# Patient Record
Sex: Female | Born: 1940 | Race: Black or African American | Hispanic: No | Smoking: Never smoker
Health system: Southern US, Community
[De-identification: ages and names within clinical notes are randomized; demographics above are authoritative.]

## PROBLEM LIST (undated history)

## (undated) DIAGNOSIS — M8580 Other specified disorders of bone density and structure, unspecified site: Secondary | ICD-10-CM

## (undated) DIAGNOSIS — E781 Pure hyperglyceridemia: Secondary | ICD-10-CM

## (undated) DIAGNOSIS — G4700 Insomnia, unspecified: Secondary | ICD-10-CM

## (undated) DIAGNOSIS — F32A Depression, unspecified: Secondary | ICD-10-CM

## (undated) DIAGNOSIS — E785 Hyperlipidemia, unspecified: Secondary | ICD-10-CM

## (undated) DIAGNOSIS — F329 Major depressive disorder, single episode, unspecified: Secondary | ICD-10-CM

## (undated) HISTORY — DX: Depression, unspecified: F32.A

## (undated) HISTORY — DX: Major depressive disorder, single episode, unspecified: F32.9

## (undated) HISTORY — DX: Hyperlipidemia, unspecified: E78.5

## (undated) HISTORY — DX: Other specified disorders of bone density and structure, unspecified site: M85.80

## (undated) HISTORY — DX: Pure hyperglyceridemia: E78.1

## (undated) HISTORY — DX: Insomnia, unspecified: G47.00

---

## 1989-02-15 HISTORY — PX: ABDOMINAL HYSTERECTOMY: SHX81

## 1995-02-16 HISTORY — PX: COSMETIC SURGERY: SHX468

## 1999-03-11 ENCOUNTER — Other Ambulatory Visit: Admission: RE | Admit: 1999-03-11 | Discharge: 1999-03-11 | Payer: Self-pay | Admitting: *Deleted

## 1999-05-05 ENCOUNTER — Ambulatory Visit (HOSPITAL_COMMUNITY): Admission: RE | Admit: 1999-05-05 | Discharge: 1999-05-05 | Payer: Self-pay | Admitting: Gastroenterology

## 2000-05-02 ENCOUNTER — Other Ambulatory Visit: Admission: RE | Admit: 2000-05-02 | Discharge: 2000-05-02 | Payer: Self-pay | Admitting: *Deleted

## 2004-10-16 LAB — HM COLONOSCOPY

## 2004-11-09 ENCOUNTER — Ambulatory Visit (HOSPITAL_COMMUNITY): Admission: RE | Admit: 2004-11-09 | Discharge: 2004-11-09 | Payer: Self-pay | Admitting: Gastroenterology

## 2004-12-03 ENCOUNTER — Ambulatory Visit: Payer: Self-pay | Admitting: Family Medicine

## 2005-04-21 ENCOUNTER — Ambulatory Visit: Payer: Self-pay | Admitting: Family Medicine

## 2005-11-23 DIAGNOSIS — M810 Age-related osteoporosis without current pathological fracture: Secondary | ICD-10-CM

## 2005-11-23 DIAGNOSIS — G4700 Insomnia, unspecified: Secondary | ICD-10-CM

## 2006-01-04 ENCOUNTER — Ambulatory Visit: Payer: Self-pay | Admitting: Family Medicine

## 2006-01-04 DIAGNOSIS — F339 Major depressive disorder, recurrent, unspecified: Secondary | ICD-10-CM | POA: Insufficient documentation

## 2006-01-05 DIAGNOSIS — E785 Hyperlipidemia, unspecified: Secondary | ICD-10-CM

## 2006-01-05 LAB — CONVERTED CEMR LAB
AST: 18 units/L (ref 0–37)
Alkaline Phosphatase: 85 units/L (ref 39–117)
BUN: 11 mg/dL (ref 6–23)
CO2: 30 meq/L (ref 19–32)
Cholesterol: 226 mg/dL — ABNORMAL HIGH (ref 0–200)
Sodium: 142 meq/L (ref 135–145)
Total CHOL/HDL Ratio: 4.3
Total Protein: 7.6 g/dL (ref 6.0–8.3)

## 2006-01-20 ENCOUNTER — Telehealth: Payer: Self-pay | Admitting: Family Medicine

## 2006-01-27 ENCOUNTER — Encounter: Payer: Self-pay | Admitting: Family Medicine

## 2007-05-11 ENCOUNTER — Ambulatory Visit: Payer: Self-pay | Admitting: Family Medicine

## 2007-05-11 ENCOUNTER — Encounter: Admission: RE | Admit: 2007-05-11 | Discharge: 2007-05-11 | Payer: Self-pay | Admitting: Family Medicine

## 2007-05-12 ENCOUNTER — Encounter: Payer: Self-pay | Admitting: Family Medicine

## 2007-05-12 LAB — CONVERTED CEMR LAB
ALT: 10 units/L (ref 0–35)
AST: 16 units/L (ref 0–37)
Albumin: 5.1 g/dL (ref 3.5–5.2)
CO2: 24 meq/L (ref 19–32)
Calcium: 10.4 mg/dL (ref 8.4–10.5)
Chloride: 103 meq/L (ref 96–112)
Cholesterol: 246 mg/dL — ABNORMAL HIGH (ref 0–200)
Creatinine, Ser: 0.65 mg/dL (ref 0.40–1.20)
Glucose, Bld: 84 mg/dL (ref 70–99)
HDL goal, serum: 40 mg/dL
HDL: 53 mg/dL (ref >39)
LDL Cholesterol: 170 mg/dL — ABNORMAL HIGH (ref 0–99)
MCHC: 32.3 g/dL (ref 30.0–36.0)
Platelets: 423 10*3/uL — ABNORMAL HIGH (ref 150–400)
Sodium: 141 meq/L (ref 135–145)
TSH: 3.86 microintl units/mL (ref 0.350–5.50)
Total Bilirubin: 0.7 mg/dL (ref 0.3–1.2)
Triglycerides: 116 mg/dL (ref <150)

## 2007-05-16 ENCOUNTER — Telehealth: Payer: Self-pay | Admitting: Family Medicine

## 2007-05-24 ENCOUNTER — Telehealth: Payer: Self-pay | Admitting: Family Medicine

## 2008-04-08 ENCOUNTER — Ambulatory Visit: Payer: Self-pay | Admitting: Family Medicine

## 2008-04-08 DIAGNOSIS — M79609 Pain in unspecified limb: Secondary | ICD-10-CM

## 2008-04-08 DIAGNOSIS — R519 Headache, unspecified: Secondary | ICD-10-CM | POA: Insufficient documentation

## 2008-04-08 DIAGNOSIS — R209 Unspecified disturbances of skin sensation: Secondary | ICD-10-CM | POA: Insufficient documentation

## 2008-04-08 DIAGNOSIS — R51 Headache: Secondary | ICD-10-CM | POA: Insufficient documentation

## 2008-04-09 ENCOUNTER — Encounter: Payer: Self-pay | Admitting: Family Medicine

## 2008-04-09 LAB — CONVERTED CEMR LAB
BUN: 11 mg/dL (ref 6–23)
Calcium: 9.9 mg/dL (ref 8.4–10.5)
Chloride: 103 meq/L (ref 96–112)
Folate: 10.4 ng/mL
Glucose, Bld: 103 mg/dL — ABNORMAL HIGH (ref 70–99)
HCT: 38.4 % (ref 36.0–46.0)
MCV: 91 fL (ref 78.0–100.0)
RBC: 4.22 M/uL (ref 3.87–5.11)
Vitamin B-12: 570 pg/mL (ref 211–911)
WBC: 7.4 10*3/uL (ref 4.0–10.5)

## 2008-05-15 ENCOUNTER — Telehealth (INDEPENDENT_AMBULATORY_CARE_PROVIDER_SITE_OTHER): Payer: Self-pay | Admitting: *Deleted

## 2008-10-29 ENCOUNTER — Ambulatory Visit: Payer: Self-pay | Admitting: Family Medicine

## 2008-11-06 ENCOUNTER — Telehealth: Payer: Self-pay | Admitting: Family Medicine

## 2008-11-19 ENCOUNTER — Encounter: Admission: RE | Admit: 2008-11-19 | Discharge: 2008-11-19 | Payer: Self-pay | Admitting: Family Medicine

## 2008-11-19 ENCOUNTER — Ambulatory Visit: Payer: Self-pay | Admitting: Family Medicine

## 2008-11-19 DIAGNOSIS — M25559 Pain in unspecified hip: Secondary | ICD-10-CM | POA: Insufficient documentation

## 2008-11-20 ENCOUNTER — Encounter: Payer: Self-pay | Admitting: Family Medicine

## 2008-12-02 ENCOUNTER — Telehealth: Payer: Self-pay | Admitting: Family Medicine

## 2008-12-11 ENCOUNTER — Encounter: Payer: Self-pay | Admitting: Family Medicine

## 2008-12-21 ENCOUNTER — Encounter: Admission: RE | Admit: 2008-12-21 | Discharge: 2008-12-21 | Payer: Self-pay | Admitting: Sports Medicine

## 2009-05-29 ENCOUNTER — Ambulatory Visit: Payer: Self-pay | Admitting: Family Medicine

## 2009-09-25 ENCOUNTER — Ambulatory Visit: Payer: Self-pay | Admitting: Family Medicine

## 2009-09-25 DIAGNOSIS — L299 Pruritus, unspecified: Secondary | ICD-10-CM | POA: Insufficient documentation

## 2009-09-26 LAB — CONVERTED CEMR LAB
ALT: 9 units/L (ref 0–35)
Albumin: 4.6 g/dL (ref 3.5–5.2)
BUN: 11 mg/dL (ref 6–23)
Chloride: 104 meq/L (ref 96–112)
Glucose, Bld: 80 mg/dL (ref 70–99)
Sodium: 140 meq/L (ref 135–145)
Total Bilirubin: 0.8 mg/dL (ref 0.3–1.2)
Total Protein: 6.9 g/dL (ref 6.0–8.3)
VLDL: 14 mg/dL (ref 0–40)

## 2009-10-09 ENCOUNTER — Ambulatory Visit: Payer: Self-pay | Admitting: Family Medicine

## 2009-10-09 DIAGNOSIS — R6882 Decreased libido: Secondary | ICD-10-CM

## 2009-10-09 DIAGNOSIS — R634 Abnormal weight loss: Secondary | ICD-10-CM

## 2010-03-19 NOTE — Assessment & Plan Note (Signed)
Summary: depression   Vital Signs:  Patient profile:   70 year old female Height:      59 inches Weight:      111 pounds BMI:     22.50 Pulse rate:   55 / minute BP sitting:   123 / 63  (left arm) Cuff size:   regular  Vitals Entered By: Avon Gully CMA, Duncan Dull) (September 25, 2009 8:14 AM) CC: f/u depression/anxiety pt is stlil lnot sleeping,fatigue,cant eat, stopped taking citalopram because it made her head  hurt   Primary Care Provider:  Linford Arnold, C  CC:  f/u depression/anxiety pt is stlil lnot sleeping, fatigue, cant eat, and stopped taking citalopram because it made her head  hurt.  History of Present Illness: f/u depression/anxiety pt is stlil lnot sleeping, fatigue, cant eat, stopped taking citalopram because it made her head  hurt.  Tried the citalopram for 30 days and it was giving her HA. Still using her AMbien for sleep.  Feeling more anxious overall .  Feels a little more irritable.  Her mom passed away since the last time I saw her from CHF.   Her feet have been itching all over. No rash. She will scratch until it breaks the skin. No rash.    Current Medications (verified): 1)  Ambien 10 Mg Tabs (Zolpidem Tartrate) .... Take 1 Tablet By Mouth Once A Day  Allergies (verified): No Known Drug Allergies  Comments:  Nurse/Medical Assistant: The patient's medications and allergies were reviewed with the patient and were updated in the Medication and Allergy Lists. Avon Gully CMA, Duncan Dull) (September 25, 2009 8:16 AM)  Family History: Father-Lung Ca-smoker Mother -abd aneurysm, CHF.   Physical Exam  General:  Well-developed,well-nourished,in no acute distress; alert,appropriate and cooperative throughout examination Neck:  No deformities, masses, or tenderness noted. NO TM.  Lungs:  Normal respiratory effort, chest expands symmetrically. Lungs are clear to auscultation, no crackles or wheezes. Heart:  Normal rate and regular rhythm. S1 and S2 normal without  gallop, murmur, click, rub or other extra sounds. Skin:  Skin on feet look normal. a few excoriation.  No rash.    Impression & Recommendations:  Problem # 1:  DISORDER, DEPRESSIVE NEC (ICD-311) PHQ-9 score is 18 (mod severe).  Discussed changing to sertraline since the citalopram gave her HA.  F/U in one month. Call if any questions.  The following medications were removed from the medication list:    Citalopram Hydrobromide 20 Mg Tabs (Citalopram hydrobromide) .Marland Kitchen... 1/2 tab by mouth for one week, then increase to one a day. Her updated medication list for this problem includes:    Sertraline Hcl 50 Mg Tabs (Sertraline hcl) .Marland Kitchen... 1/2 tab by mouth daily then incrase to whole tab.  Problem # 2:  PRURITUS (ICD-698.9) Will check liver and thyrod levels.  Though no rash to cause his sxs.  May be related to her anxiety.  Orders: T-Comprehensive Metabolic Panel ([id number]) T-TSH ([id number])  Complete Medication List: 1)  Ambien 10 Mg Tabs (Zolpidem tartrate) .... Take 1 tablet by mouth once a day 2)  Sertraline Hcl 50 Mg Tabs (Sertraline hcl) .... 1/2 tab by mouth daily then incrase to whole tab.  Other Orders: T-Lipid Profile ([id number])  Patient Instructions: 1)  Please schedule a follow-up appointment in 1 month for mood. 2)  Will start sertraline.   Prescriptions: AMBIEN 10 MG TABS (ZOLPIDEM TARTRATE) Take 1 tablet by mouth once a day  #30 x 2   Entered and Authorized by:  Nani Gasser MD   Signed by:   Nani Gasser MD on 09/25/2009   Method used:   Printed then faxed to ...       [facility] [phone]* (retail)       [address] [place], [place]  [address]       Ph: [phone]       Fax: [phone]   RxID:   [id number] SERTRALINE HCL 50 MG TABS (SERTRALINE HCL) 1/2 tab by mouth daily then incrase to whole tab.  #30 x 1   Entered and Authorized by:   Nani Gasser MD   Signed by:   Nani Gasser MD on 09/25/2009   Method used:    Printed then faxed to ...       [facility] [phone]* (retail)       [address] [place], [place]  [address]       Ph: [phone]       Fax: [phone]   RxID:   [id number]

## 2010-03-19 NOTE — Assessment & Plan Note (Signed)
Summary: Discuss wt loss   Vital Signs:  Patient profile:   70 year old female Height:      59 inches Weight:      111 pounds Pulse rate:   58 / minute BP sitting:   123 / 66  (left arm) Cuff size:   small  Vitals Entered By: Avon Gully CMA, Duncan Dull) (October 09, 2009 3:43 PM) CC: wants periactin to stop wt loss   Primary Care Jaleeyah Munce:  Linford Arnold, C  CC:  wants periactin to stop wt loss.  History of Present Illness: Has lost of appetite and has been on periactin in the past.  Feels nauseated if she tries to eat. Has been downto 89 lbs in the past. Off it for years.  Her sister has had to use it as well.  She tolerates it well and was only using the once a day dose.   did start the sertaline and is not having HA with it. Says has already notice some iprovement.    She would also like to restart estratest to improve her sex drive. Has been off it for years but would like to restart it for a short period of time.   Current Medications (verified): 1)  Ambien 10 Mg Tabs (Zolpidem Tartrate) .... Take 1 Tablet By Mouth Once A Day 2)  Sertraline Hcl 50 Mg Tabs (Sertraline Hcl) .... 1/2 Tab By Mouth Daily Then Incrase To Whole Tab.  Allergies (verified): No Known Drug Allergies  Comments:  Nurse/Medical Assistant: The patient's medications and allergies were reviewed with the patient and were updated in the Medication and Allergy Lists. Avon Gully CMA, Duncan Dull) (October 09, 2009 3:45 PM)  Physical Exam  General:  Well-developed,well-nourished,in no acute distress; alert,appropriate and cooperative throughout examination Psych:  Cognition and judgment appear intact. Alert and cooperative with normal attention span and concentration. No apparent delusions, illusions, hallucinations   Impression & Recommendations:  Problem # 1:  DISORDER, DEPRESSIVE NEC (ICD-311) F/ in one month. Repeat PHQ-9 and adjust dose if needed. I am happy she isnot having SE with this one.  Her  updated medication list for this problem includes:    Sertraline Hcl 50 Mg Tabs (Sertraline hcl) .Marland Kitchen... 1/2 tab by mouth daily then incrase to whole tab.  Problem # 2:  LIBIDO, DECREASED (ICD-799.81) Will restart HRT for a short period of time.   Problem # 3:  WEIGHT LOSS, ABNORMAL (ICD-783.21) Assessment: New Will start periactin. F/U if not improving her weight.  Call if continues to lose weight.  Dsicussed potential SE. She has done well on it in the past. I was also hopin inmproving her mood will help her appetite as well.   Complete Medication List: 1)  Ambien 10 Mg Tabs (Zolpidem tartrate) .... Take 1 tablet by mouth once a day 2)  Sertraline Hcl 50 Mg Tabs (Sertraline hcl) .... 1/2 tab by mouth daily then incrase to whole tab. 3)  Cyproheptadine Hcl 4 Mg Tabs (Cyproheptadine hcl) .... Take 1 tablet by mouth once a day 4)  Estratest  .... Take 1 tablet by mouth once a day Prescriptions: ESTRATEST Take 1 tablet by mouth once a day  #30 x 3   Entered and Authorized by:   Nani Gasser MD   Signed by:   Nani Gasser MD on 10/09/2009   Method used:   Printed then faxed to ...       [facility] [phone]* (retail)  [address], [place]  [address]       Ph: [phone]       Fax: [phone]   RxID:   [id number] CYPROHEPTADINE HCL 4 MG TABS (CYPROHEPTADINE HCL) Take 1 tablet by mouth once a day  #30 x 4   Entered and Authorized by:   Nani Gasser MD   Signed by:   Nani Gasser MD on 10/09/2009   Method used:   Electronically to        [facility] [phone]* (retail)       [address] [place], [place]  [address]       Ph: [phone]       Fax: [phone]   RxID:   [id number]

## 2010-03-19 NOTE — Letter (Signed)
Summary: Depression Questionnaire  Depression Questionnaire   Imported By: Lanelle Bal 10/06/2009 12:37:02  _____________________________________________________________________  External Attachment:    Type:   Image     Comment:   External Document

## 2010-03-19 NOTE — Assessment & Plan Note (Signed)
Summary: Depression   Vital Signs:  Patient profile:   70 year old female Height:      59 inches Weight:      122 pounds Pulse rate:   65 / minute BP sitting:   148 / 76  (left arm) Cuff size:   regular  Vitals Entered By: Kathlene November (May 29, 2009 3:58 PM) CC: not sleeping, having anxiety. Not on any meds at all stopped them all   Primary Care Provider:  Linford Arnold, C  CC:  not sleeping and having anxiety. Not on any meds at all stopped them all.  History of Present Illness: not sleeping, having anxiety. Not on any meds at all stopped them all. Very anxious.  Gets upset very easily.  Not sleeping well at all.  Helping take care of her mom on the weekend and still working full time. Sisters have been helping as well. Has a vacation coming up. Hx of chronic insomnia. Does OK on ambien and wouldl ike  a refill for as needed use. No suicidal thoughts.   Current Medications (verified): 1)  None  Allergies (verified): No Known Drug Allergies  Comments:  Nurse/Medical Assistant: The patient's medications and allergies were reviewed with the patient and were updated in the Medication and Allergy Lists. Kathlene November (May 29, 2009 3:59 PM)  Physical Exam  General:  Well-developed,well-nourished,in no acute distress; alert,appropriate and cooperative throughout examination Psych:  Cognition and judgment appear intact. Alert and cooperative with normal attention span and concentration. No apparent delusions, illusions, hallucinations   Impression & Recommendations:  Problem # 1:  DISORDER, DEPRESSIVE NEC (ICD-311) Assessment Deteriorated  Discusssed dx.   Her PHQ-9 score is 20 (severe) depression.  Dsicussed tx options with counseling, medication, exercise.  Pt prefers medication. Wills tart with citalopram. REviewed how the drugs works and potential SE. F//U in 3 weeks to make sure doing well and for dose titratin if needed. Will repeat PHQ-9 at that time.   Her updated  medication list for this problem includes:    Citalopram Hydrobromide 20 Mg Tabs (Citalopram hydrobromide) .Marland Kitchen... 1/2 tab by mouth for one week, then increase to one a day.  Complete Medication List: 1)  Citalopram Hydrobromide 20 Mg Tabs (Citalopram hydrobromide) .... 1/2 tab by mouth for one week, then increase to one a day. 2)  Ambien 10 Mg Tabs (Zolpidem tartrate) .... Take 1 tablet by mouth once a day  Patient Instructions: 1)  Please schedule a follow-up appointment in 3-4 weeks to check on your mood. .  Prescriptions: AMBIEN 10 MG TABS (ZOLPIDEM TARTRATE) Take 1 tablet by mouth once a day  #30 x 0   Entered and Authorized by:   Nani Gasser MD   Signed by:   Nani Gasser MD on 05/29/2009   Method used:   Printed then faxed to ...       [facility] [phone]* (retail)       [address] [place], [place]  [address]       Ph: [phone]       Fax: [phone]   RxID:   [id number] CITALOPRAM HYDROBROMIDE 20 MG TABS (CITALOPRAM HYDROBROMIDE) 1/2 tab by mouth for one week, then increase to one a day.  #30 x 0   Entered and Authorized by:   Nani Gasser MD   Signed by:   Nani Gasser MD on 05/29/2009   Method used:   Electronically to  [facility] [phone]* (retail)       [address] [place], [place]  [address]       Ph: [phone]       Fax: [phone]   RxID:   [id number]

## 2010-06-16 ENCOUNTER — Encounter: Payer: Self-pay | Admitting: Family Medicine

## 2010-06-16 ENCOUNTER — Ambulatory Visit: Payer: Self-pay | Admitting: Family Medicine

## 2010-06-17 ENCOUNTER — Encounter: Payer: Self-pay | Admitting: Family Medicine

## 2010-06-17 ENCOUNTER — Ambulatory Visit (INDEPENDENT_AMBULATORY_CARE_PROVIDER_SITE_OTHER): Payer: 59 | Admitting: Family Medicine

## 2010-06-17 DIAGNOSIS — E785 Hyperlipidemia, unspecified: Secondary | ICD-10-CM

## 2010-06-17 DIAGNOSIS — F329 Major depressive disorder, single episode, unspecified: Secondary | ICD-10-CM

## 2010-06-17 DIAGNOSIS — Z79890 Hormone replacement therapy (postmenopausal): Secondary | ICD-10-CM | POA: Insufficient documentation

## 2010-06-17 DIAGNOSIS — G4700 Insomnia, unspecified: Secondary | ICD-10-CM

## 2010-06-17 DIAGNOSIS — Z1231 Encounter for screening mammogram for malignant neoplasm of breast: Secondary | ICD-10-CM

## 2010-06-17 DIAGNOSIS — M8580 Other specified disorders of bone density and structure, unspecified site: Secondary | ICD-10-CM

## 2010-06-17 DIAGNOSIS — R634 Abnormal weight loss: Secondary | ICD-10-CM

## 2010-06-17 DIAGNOSIS — M949 Disorder of cartilage, unspecified: Secondary | ICD-10-CM

## 2010-06-17 MED ORDER — ZOLPIDEM TARTRATE 10 MG PO TABS: 10.0000 mg | ORAL_TABLET | Freq: Every day | ORAL | Status: DC

## 2010-06-17 MED ORDER — EST ESTROGENS-METHYLTEST 0.625-1.25 MG PO TABS: 1.0000 | ORAL_TABLET | Freq: Every day | ORAL | Status: DC

## 2010-06-17 MED ORDER — SERTRALINE HCL 50 MG PO TABS: 50.0000 mg | ORAL_TABLET | Freq: Every day | ORAL | Status: DC

## 2010-06-17 NOTE — Assessment & Plan Note (Signed)
Due to recheck. She is not on any cholesterol meds.

## 2010-06-17 NOTE — Assessment & Plan Note (Signed)
Will refill her ambien.  

## 2010-06-17 NOTE — Progress Notes (Signed)
  Subjective:    Patient ID: Christine [name], female    DOB: [birth year], 70 y.o.   MRN: [id number]  HPI Her to ger her mammo and dexa order. Colonoscopy is up to date.  Tdap and pneumococcal are up to date.   She has been out of her meds and wants to restart her HRT and her sertaline as well as her [place].    Review of Systems     Objective:   Physical Exam  Constitutional: She appears well-developed and well-nourished.  HENT:  Head: Normocephalic and atraumatic.  Neck: Neck supple. No thyromegaly present.       No carotid bruits.   Cardiovascular: Normal rate, regular rhythm and normal heart sounds.   Pulmonary/Chest: Effort normal and breath sounds normal.  Musculoskeletal: She exhibits no edema.  Lymphadenopathy:    She has no cervical adenopathy.          Assessment & Plan:

## 2010-06-17 NOTE — Patient Instructions (Signed)
Follow up in one year. Call if not doing well.

## 2010-06-17 NOTE — Assessment & Plan Note (Signed)
Out of of zoloft. She would like to restart it. New rx sent.

## 2010-06-18 ENCOUNTER — Telehealth: Payer: Self-pay | Admitting: Family Medicine

## 2010-06-18 LAB — COMPLETE METABOLIC PANEL WITH GFR
Albumin: 4.7 g/dL (ref 3.5–5.2)
Alkaline Phosphatase: 73 U/L (ref 39–117)
CO2: 25 mEq/L (ref 19–32)
Calcium: 10.2 mg/dL (ref 8.4–10.5)
Chloride: 104 mEq/L (ref 96–112)
GFR, Est African American: >60 mL/min (ref >60)
GFR, Est Non African American: >60 mL/min (ref >60)
Glucose, Bld: 85 mg/dL (ref 70–99)
Potassium: 5.1 mEq/L (ref 3.5–5.3)
Total Protein: 7.6 g/dL (ref 6.0–8.3)

## 2010-06-18 LAB — LIPID PANEL
Cholesterol: 266 mg/dL — ABNORMAL HIGH (ref 0–200)
HDL: 54 mg/dL (ref >39)
Triglycerides: 75 mg/dL (ref <150)

## 2010-06-18 NOTE — Telephone Encounter (Signed)
Call pt: CMP is nl. Chol is really high.  She really needs to be on a cholesteol pill. Let me know if ok with starting one.

## 2010-06-19 ENCOUNTER — Other Ambulatory Visit: Payer: 59

## 2010-06-19 ENCOUNTER — Telehealth: Payer: Self-pay | Admitting: *Deleted

## 2010-06-19 ENCOUNTER — Ambulatory Visit: Payer: 59

## 2010-06-19 MED ORDER — ATORVASTATIN CALCIUM 40 MG PO TABS: 40.0000 mg | ORAL_TABLET | Freq: Every day | ORAL | Status: DC

## 2010-06-19 NOTE — Telephone Encounter (Signed)
Left message on pt's cell with results and dr. Vernie Ammons and to call bck if ok to start med

## 2010-06-19 NOTE — Telephone Encounter (Signed)
Pt called and states its ok to send in chol med to [facility] [facility] [place]

## 2010-06-29 ENCOUNTER — Telehealth: Payer: Self-pay | Admitting: Family Medicine

## 2010-06-29 NOTE — Telephone Encounter (Signed)
Pt was given a new chol med and the copay was too expensive $137.49 30 day supply.  Pt would like a diff script for something cheaper..instead of the Lipitor 40 mg.  Please advise.\ Plan:  Routed to Dr. Marlyne Beards, LPN Domingo Dimes

## 2010-06-29 NOTE — Telephone Encounter (Signed)
Do you know if this was for the generic? Does she have medicare?

## 2010-07-01 ENCOUNTER — Telehealth: Payer: Self-pay | Admitting: Family Medicine

## 2010-07-01 NOTE — Telephone Encounter (Signed)
This was the lipitor that was rx'ed that was too expensive

## 2010-07-01 NOTE — Telephone Encounter (Signed)
Pt called and states a medication that was rx last week was too expensive and wants to know if there is an alternative

## 2010-07-01 NOTE — Telephone Encounter (Signed)
Dr. Linford Arnold, Called the pharmacist and Lipitor just went generic(Torvastatin) and is still a high copay of $137.59 for 30 day supply.  The simvastatin, lovastatin, or pravastatin is all under $10.00 copay.  Please advise as to what to do with the Lipitor 40 mg script.  Thanks. Plan:  Routed to Dr. Marlyne Beards, LPN Domingo Dimes

## 2010-07-02 ENCOUNTER — Telehealth: Payer: Self-pay | Admitting: Family Medicine

## 2010-07-02 MED ORDER — SIMVASTATIN 40 MG PO TABS: 40.0000 mg | ORAL_TABLET | Freq: Every day | ORAL | Status: DC

## 2010-07-02 NOTE — Telephone Encounter (Signed)
Can you see if she has medicare or not?

## 2010-07-02 NOTE — Telephone Encounter (Signed)
We Can change to simvastatin.  New rx sent.

## 2010-07-02 NOTE — Telephone Encounter (Signed)
Tried to call pt but unable to get .  A new script for simvastatin 40 mg PO was sent to pt pharmacy and a message was left stating the copay should be under $10 copay since generic.  Told pt to call with any questions or concerns. Jarvis Newcomer, LPN Domingo Dimes

## 2010-07-03 NOTE — Op Note (Signed)
NAMELADYE, Christine [name]             ACCOUNT NO.:  [id number]   MEDICAL RECORD NO.:  [id number]          PATIENT TYPE:  AMB   LOCATION:  ENDO                         FACILITY:  [facility]   PHYSICIAN:  Anselmo Rod, M.D.  DATE OF BIRTH:  [birth year]   DATE OF PROCEDURE:  11/09/2004  DATE OF DISCHARGE:                                 OPERATIVE REPORT   PROCEDURE PERFORMED:  Screening colonoscopy.   ENDOSCOPIST:  Charna Elizabeth, M.D.   INSTRUMENT USED:  Olympus video colonoscope.   INDICATIONS FOR PROCEDURE:  The patient is a 70 year old African-American  female undergoing screening colonoscopy to rule out colonic polyps, masses,  etc.   PREPROCEDURE PREPARATION:  Informed consent was procured from the patient.  The patient was fasted for four hours prior to the procedure and prepped  OsmoPrep pills (20 pills) the night prior to and (12 pills) the morning of  the procedure.  The risks and benefits of the procedure including a 10% miss  rate for polyps or cancers was discussed with the patient as well.   PREPROCEDURE PHYSICAL:  The patient had stable vital signs.  Neck supple.  Chest clear to auscultation.  S1 and S2 regular.  Abdomen soft with normal  bowel sounds.   DESCRIPTION OF PROCEDURE:  The patient was placed in left lateral decubitus  position and sedated with 60 mg of Demerol and 6 mg of Versed in slow  incremental doses.  Once the patient was adequately sedated and maintained  on low flow oxygen and continuous cardiac monitoring, the Olympus video  colonoscope was advanced from the rectum to the cecum.  The appendicular  orifice and ileocecal valve were clearly visualized and photographed.  The  terminal ileum appeared normal.  A few scattered diverticula were seen  throughout the colon.  Prominent internal hemorrhoids were seen on  retroflexion.  The terminal ileum appeared normal.  No masses or polyps were  appreciated and the patient tolerated the procedure well without  complication.   IMPRESSION:  1.  Few scattered diverticula seen in the colon.  2.  Prominent internal hemorrhoids.  3.  Normal terminal ileum.  4.  No masses or polyps seen.   RECOMMENDATIONS:  1.  Repeat colonoscopy is recommended in the next 10 years unless the      patient develops any abnormal symptoms in the interim.  2.  A high fiber diet with liberal fluid intake has been advocated and      brochures on diverticulosis have been given to the patient for her      education.  3.  Outpatient followup is advised as need arises in the future.      Anselmo Rod, M.D.  Electronically Signed     JNM/MEDQ  D:  11/09/2004  T:  11/10/2004  Job:  [id number]   cc:   Andres Ege, M.D.  Fax: [phone]

## 2010-07-03 NOTE — Op Note (Signed)
[facility]  Patient:    [name], Christine                      MRN: [id number] Proc. Date: 05/05/99 Adm. Date:  [id number] Attending:  Charna Elizabeth CC:         Heather Roberts, M.D.                           Operative Report  DATE OF BIRTH:  [birth year].  REFERRING PHYSICIAN:  Heather Roberts, M.D.  PROCEDURE PERFORMED:   Flexible sigmoidoscopy.  ENDOSCOPIST:  Anselmo Rod, M.D.  INSTRUMENT USED:  Olympus video colonoscope.  INDICATION FOR PROCEDURE:  Fleeting flexible sigmoidoscopy being performed in a  70 year old black female to rule out masses, polyps.  PREPROCEDURE PREPARATION:  Informed consent was procured from the patient. Patient was fasted for eight hours prior to the procedure and prepped with two Fleets enemas the morning of the procedure.  PREPROCEDURE PHYSICAL:  VITAL SIGNS:  The patient had stable vital signs.  NECK:  Supple.  CHEST:  Clear to auscultation.  S1, S2 regular.  ABDOMEN:  Soft, with normal abdominal bowel sounds.  DESCRIPTION OF PROCEDURE:  The patient was placed in the left lateral decubitus  position and no sedation was used.  Once the patient was adequately positioned, the Olympus video colonoscope was advanced from the rectum to 70 cm without difficulty. One isolated diverticulum was seen in the mid left colon.  Small internal hemorrhoids were seen.  No other abnormalities were recognized up to 70 cm. The patient tolerated the procedure well without complication.  IMPRESSION: 1. Small internal hemorrhoid. 2. One isolated diverticulum in the mid left colon. 3. No masses or polyps seen.  RECOMMENDATIONS: 1. The patient has been advised to increase her fluid and fiber in her diet. 2. Repeat colorectal cancer screening ______ for the next five years or    earlier if she were to develop any symptoms in the interim. 3. She has been advised to follow up with Dr. Orson Aloe and to contact me with   further GI problems. DD:  05/05/99 TD:  05/05/99 Job: [id number]

## 2010-07-07 ENCOUNTER — Telehealth: Payer: Self-pay | Admitting: Family Medicine

## 2010-07-07 ENCOUNTER — Ambulatory Visit
Admission: RE | Admit: 2010-07-07 | Discharge: 2010-07-07 | Disposition: A | Discharge status: Home / Self Care | Payer: 59 | Source: Ambulatory Visit | Attending: Family Medicine | Admitting: Family Medicine

## 2010-07-07 DIAGNOSIS — Z1231 Encounter for screening mammogram for malignant neoplasm of breast: Secondary | ICD-10-CM

## 2010-07-07 DIAGNOSIS — M8580 Other specified disorders of bone density and structure, unspecified site: Secondary | ICD-10-CM

## 2010-07-07 NOTE — Telephone Encounter (Signed)
Pt notified and a copy of her recent labs were mailed to her per the pt request. Jarvis Newcomer, LPN Domingo Dimes

## 2010-07-07 NOTE — Telephone Encounter (Signed)
Pt walked in and is requesting to get cholesterol results.

## 2010-07-08 ENCOUNTER — Telehealth: Payer: Self-pay | Admitting: Family Medicine

## 2010-07-08 NOTE — Telephone Encounter (Signed)
Call pt: Done densiyt reveals osteoporosis.  I would recommend a bisphosphonate the reduce the risk of fractures.  It can be takine once a week or once a month depending on the brand.  Have to sit upright for one hour after take med. Let me know if ok with sending med. Then repeat DEXA in 2 years.

## 2010-07-09 ENCOUNTER — Telehealth: Payer: Self-pay | Admitting: Family Medicine

## 2010-07-09 NOTE — Telephone Encounter (Signed)
Call pt: Christine [name] is nl. Repeat in one year.

## 2010-07-10 MED ORDER — ALENDRONATE SODIUM 70 MG PO TABS: 70.0000 mg | ORAL_TABLET | ORAL | Status: AC

## 2010-07-10 NOTE — Telephone Encounter (Signed)
Pt notified and pt would like a gen sent to pharm. She states the one she had before was 137.00 and she cant afford that. She didn't remember the name of the medication that sent before

## 2010-07-10 NOTE — Telephone Encounter (Signed)
Pt.notified

## 2010-07-16 NOTE — Telephone Encounter (Signed)
Looks like she has [employer]. Jarvis Newcomer, LPN Domingo Dimes

## 2010-07-17 ENCOUNTER — Telehealth: Payer: Self-pay | Admitting: Family Medicine

## 2010-07-17 NOTE — Telephone Encounter (Signed)
We already sent a new rx on 5/17 for simvastatin which should only be about $10, instead of the lipitor. Has she rechecked her pharm?  Can you check and make sure we sent it to the right pharm.

## 2010-07-17 NOTE — Telephone Encounter (Signed)
Pt aware that the script was at her pharmacy for the simvastatin and that it was changed from the Lipitor. Jarvis Newcomer, LPN Domingo Dimes

## 2010-07-17 NOTE — Telephone Encounter (Signed)
Encounter closed. Akima Slaugh, LPN /Triage  

## 2011-02-12 ENCOUNTER — Other Ambulatory Visit: Payer: Self-pay | Admitting: Family Medicine

## 2011-02-12 ENCOUNTER — Other Ambulatory Visit: Payer: Self-pay | Admitting: *Deleted

## 2011-02-12 MED ORDER — EST ESTROGENS-METHYLTEST 0.625-1.25 MG PO TABS: 1.0000 | ORAL_TABLET | Freq: Every day | ORAL | Status: DC

## 2011-07-22 IMAGING — CR DG LUMBAR SPINE COMPLETE 4+V
5 series · 5 of 5 positions shown · non-contrast
Comparison: None

CLINICAL DATA: Low back and right hip pain.  No history of trauma.

LUMBAR SPINE - COMPLETE 4+ VIEW

[view not recorded (1 of 5)]
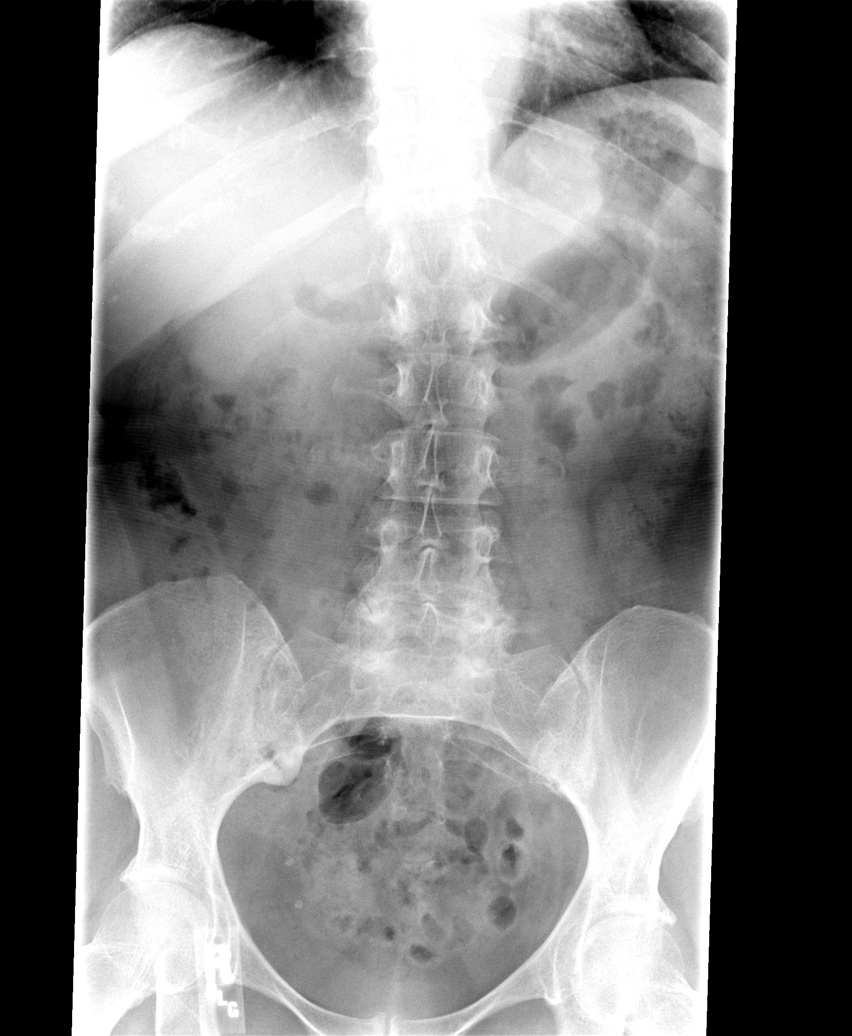

[view not recorded (2 of 5)]
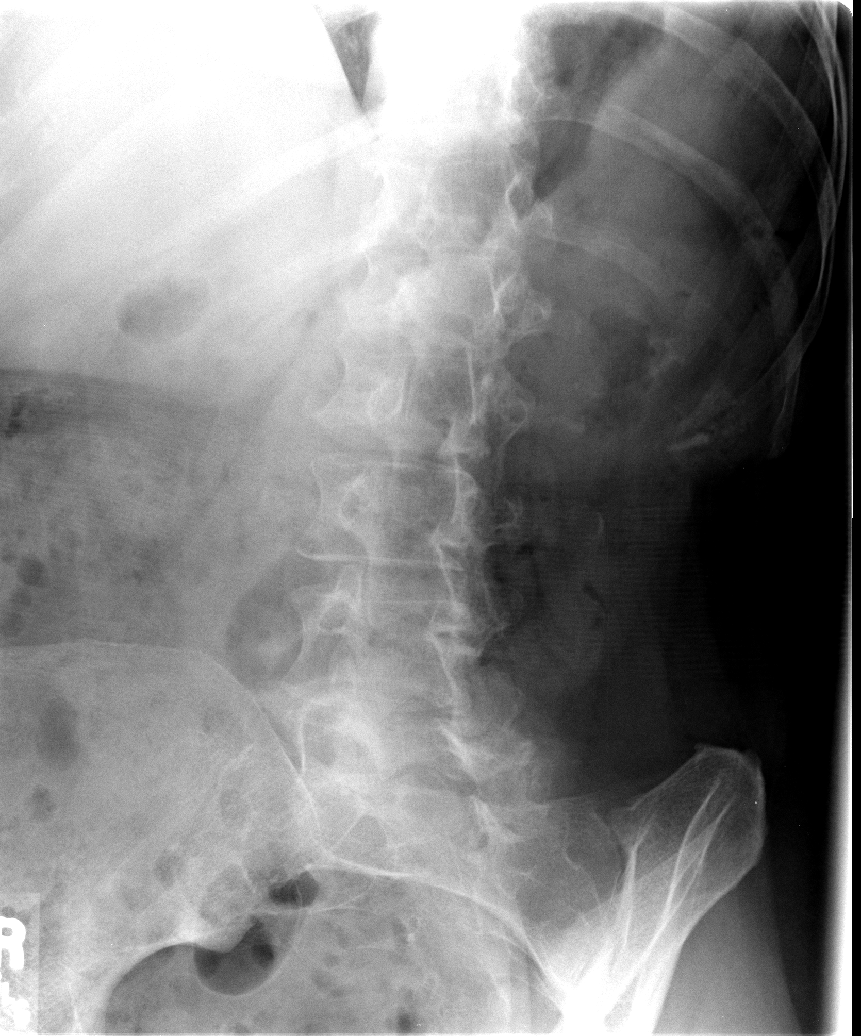

[view not recorded (3 of 5)]
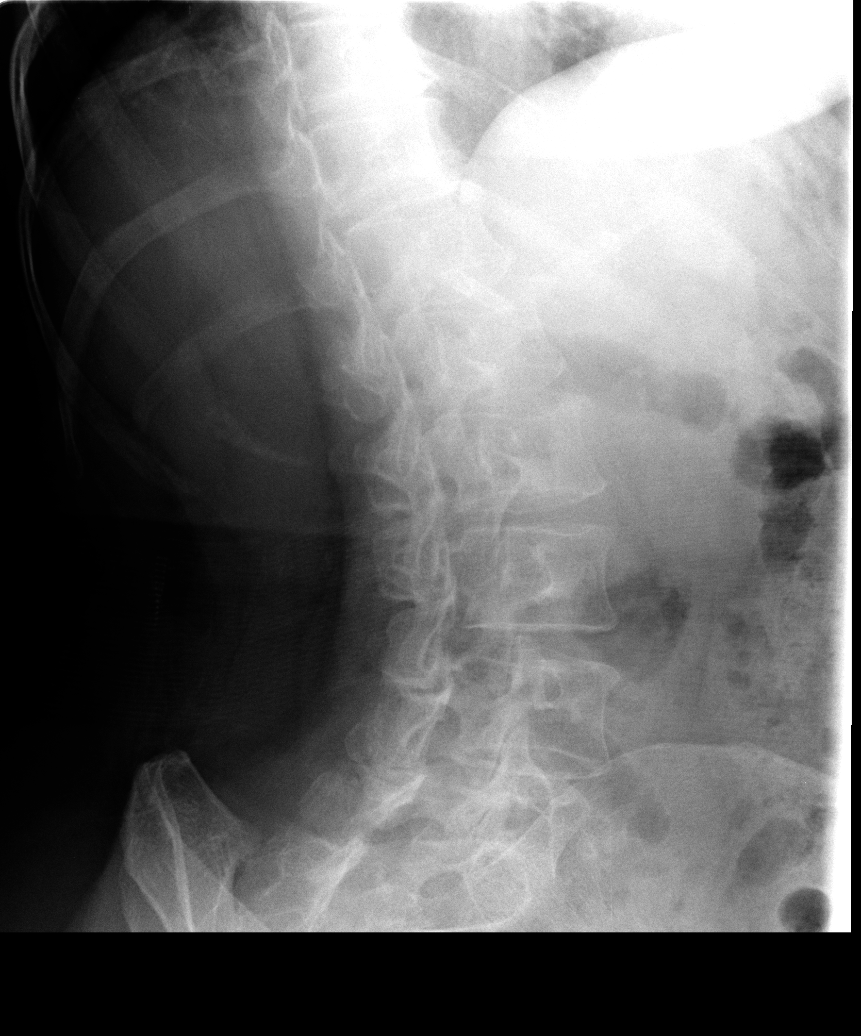

[view not recorded (4 of 5)]
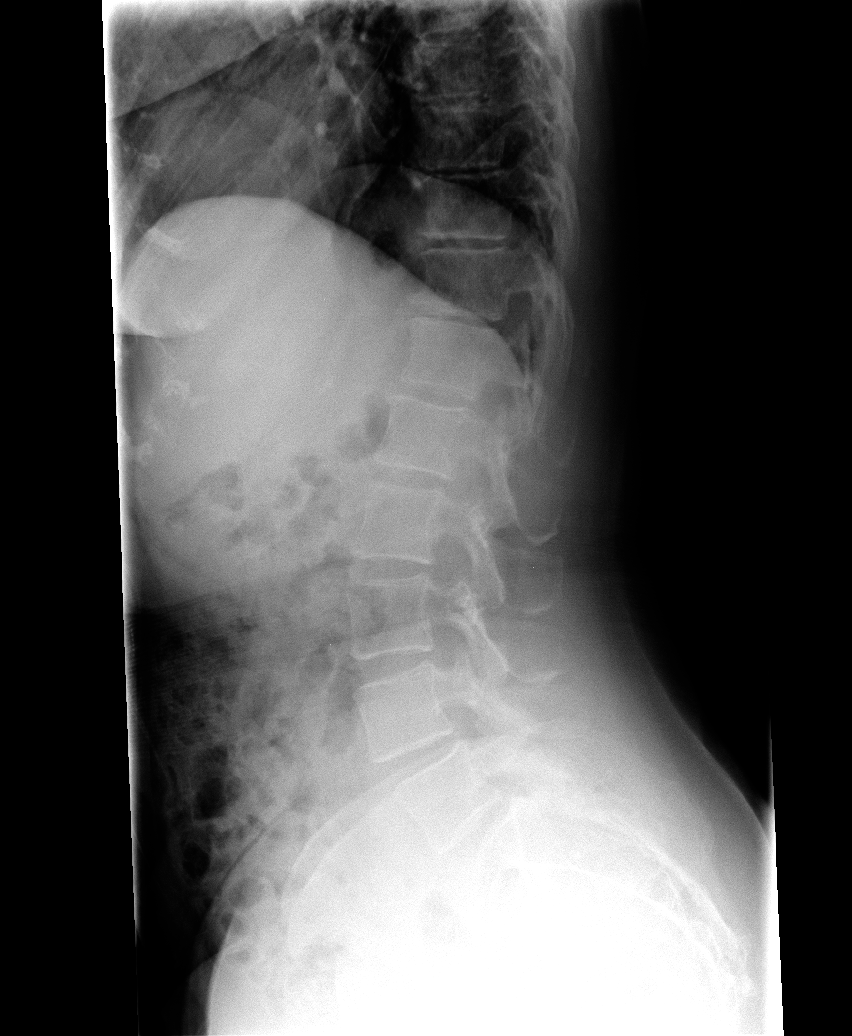

[view not recorded (5 of 5)]
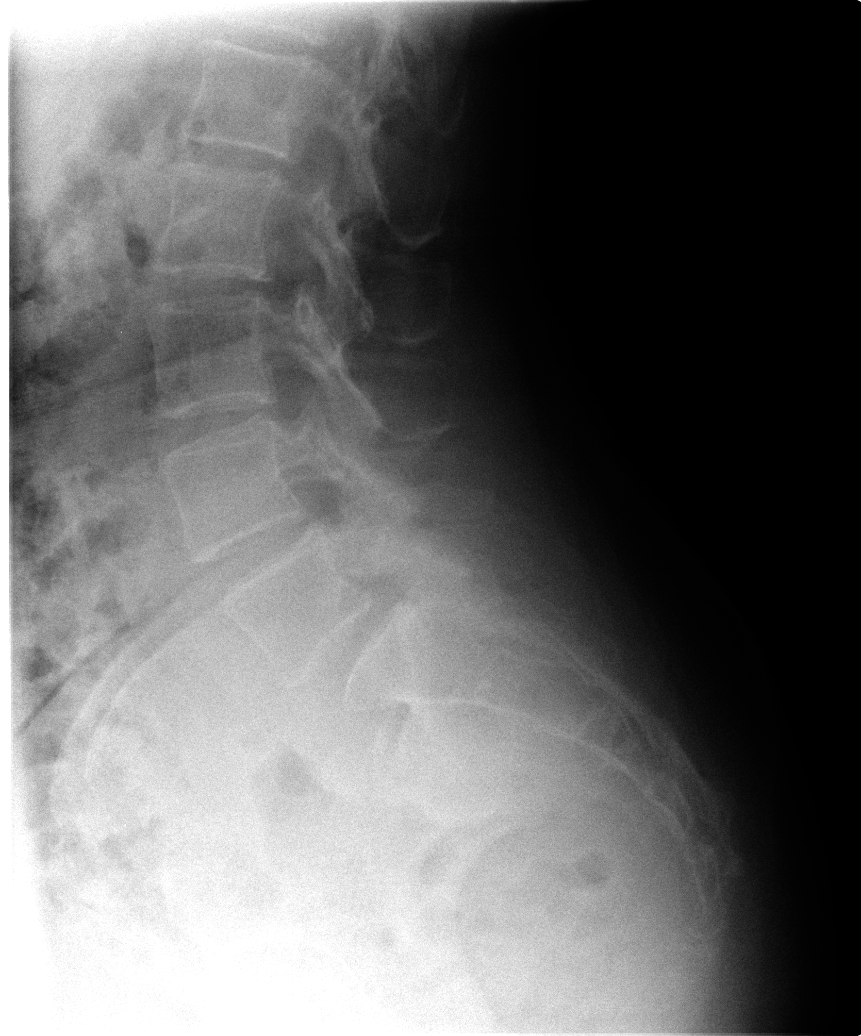

[5 of 5 positions shown; findings below may reference images not displayed]

FINDINGS: Five lumbar-type vertebral bodies.  Minimal convex left
lumbar spinal curvature centered about L3.  Moderate degenerative
changes involving the sacroiliac joints bilaterally.  Sclerosis and
osteophyte formation.

Maintenance of vertebral body height, alignment.  Intervertebral
disc heights are maintained.  Trace L5-S1 anterolisthesis.  Facet
arthropathy at L5-S1 is mild.
IMPRESSION: 1.  Mild lower lumbar spondylosis with trace L5-S1 anterolisthesis,
likely degenerative.
2.  Degenerative changes of the sacroiliac joints bilaterally.
Moderate.

## 2011-11-18 ENCOUNTER — Ambulatory Visit (INDEPENDENT_AMBULATORY_CARE_PROVIDER_SITE_OTHER): Payer: 59 | Admitting: Family Medicine

## 2011-11-18 ENCOUNTER — Encounter: Payer: Self-pay | Admitting: Family Medicine

## 2011-11-18 VITALS — BP 140/74 | HR 72 | Ht 62.0 in | Wt 125.0 lb

## 2011-11-18 DIAGNOSIS — Z23 Encounter for immunization: Secondary | ICD-10-CM

## 2011-11-18 DIAGNOSIS — E785 Hyperlipidemia, unspecified: Secondary | ICD-10-CM

## 2011-11-18 DIAGNOSIS — Z1231 Encounter for screening mammogram for malignant neoplasm of breast: Secondary | ICD-10-CM

## 2011-11-18 DIAGNOSIS — Z0000 Encounter for general adult medical examination without abnormal findings: Secondary | ICD-10-CM

## 2011-11-18 DIAGNOSIS — Z8249 Family history of ischemic heart disease and other diseases of the circulatory system: Secondary | ICD-10-CM

## 2011-11-18 MED ORDER — EST ESTROGENS-METHYLTEST 0.625-1.25 MG PO TABS: 1.0000 | ORAL_TABLET | Freq: Every day | ORAL | Status: DC

## 2011-11-18 MED ORDER — ZOLPIDEM TARTRATE 10 MG PO TABS: 10.0000 mg | ORAL_TABLET | Freq: Every evening | ORAL | Status: DC | PRN

## 2011-11-18 MED ORDER — AMBULATORY NON FORMULARY MEDICATION: Status: DC

## 2011-11-18 MED ORDER — SIMVASTATIN 40 MG PO TABS: 40.0000 mg | ORAL_TABLET | Freq: Every day | ORAL | Status: DC

## 2011-11-18 MED ORDER — SERTRALINE HCL 50 MG PO TABS: 50.0000 mg | ORAL_TABLET | Freq: Every day | ORAL | Status: DC

## 2011-11-18 NOTE — Progress Notes (Signed)
Subjective:    Christine [name] is a 71 y.o. female who presents for Medicare Annual/Subsequent preventive examination.  Preventive Screening-Counseling & Management  Tobacco History  Smoking status  . Never Smoker   Smokeless tobacco  . Not on file     Problems Prior to Visit 1.   Current Problems (verified) Patient Active Problem List  Diagnosis  . HYPERLIPIDEMIA  . DISORDER, DEPRESSIVE NEC  . PAIN IN JOINT PELVIC REGION AND THIGH  . OSTEOPENIA  . INSOMNIA NOS  . PARESTHESIA  . HEADACHE  . Postmenopausal HRT (hormone replacement therapy)    Medications Prior to Visit Current Outpatient Prescriptions on File Prior to Visit  Medication Sig Dispense Refill  . DISCONTD: estrogen-methylTESTOSTERone (EST ESTROGENS-METHYLTEST HS) 0.625-1.25 MG per tablet Take 1 tablet by mouth daily.  30 tablet  0  . DISCONTD: sertraline (ZOLOFT) 50 MG tablet Take 1 tablet (50 mg total) by mouth daily. Take (1/2) tab daily  30 tablet  11  . DISCONTD: simvastatin (ZOCOR) 40 MG tablet Take 1 tablet (40 mg total) by mouth at bedtime.  30 tablet  3  . DISCONTD: zolpidem (AMBIEN) 10 MG tablet take 1 tablet by mouth at bedtime  30 tablet  0    Current Medications (verified) Current Outpatient Prescriptions  Medication Sig Dispense Refill  . estrogen-methylTESTOSTERone (EST ESTROGENS-METHYLTEST HS) 0.625-1.25 MG per tablet Take 1 tablet by mouth daily.  90 tablet  3  . sertraline (ZOLOFT) 50 MG tablet Take 1 tablet (50 mg total) by mouth daily. Take (1/2) tab daily  90 tablet  2  . simvastatin (ZOCOR) 40 MG tablet Take 1 tablet (40 mg total) by mouth at bedtime.  90 tablet  3  . zolpidem (AMBIEN) 10 MG tablet Take 1 tablet (10 mg total) by mouth at bedtime as needed for sleep.  30 tablet  3  . DISCONTD: estrogen-methylTESTOSTERone (EST ESTROGENS-METHYLTEST HS) 0.625-1.25 MG per tablet Take 1 tablet by mouth daily.  30 tablet  0  . DISCONTD: sertraline (ZOLOFT) 50 MG tablet Take 1 tablet (50 mg  total) by mouth daily. Take (1/2) tab daily  30 tablet  11  . DISCONTD: simvastatin (ZOCOR) 40 MG tablet Take 1 tablet (40 mg total) by mouth at bedtime.  30 tablet  3  . DISCONTD: simvastatin (ZOCOR) 40 MG tablet Take 40 mg by mouth at bedtime.      Marland Kitchen DISCONTD: zolpidem (AMBIEN) 10 MG tablet take 1 tablet by mouth at bedtime  30 tablet  0  . AMBULATORY NON FORMULARY MEDICATION Medication Name: Zostavax IM x 1  1 vial  0     Allergies (verified) Review of patient's allergies indicates no known allergies.   PAST HISTORY  Family History Family History  Problem Relation Age of Onset  . Aneurysm Mother     abdominal  . Heart failure Mother   . Lung cancer Father     smoker    Social History History  Substance Use Topics  . Smoking status: Never Smoker   . Smokeless tobacco: Not on file  . Alcohol Use: No     Are there smokers in your home (other than you)? No  Risk Factors Current exercise habits: The patient does not participate in regular exercise at present.  Dietary issues discussed: none   Cardiac risk factors: advanced age (older than 83 for men, 101 for women) and dyslipidemia.  Depression Screen (Note: if answer to either of the following is "Yes", a more complete depression screening is  indicated)   Over the past two weeks, have you felt down, depressed or hopeless? Yes  Over the past two weeks, have you felt little interest or pleasure in doing things? No  Have you lost interest or pleasure in daily life? No  Do you often feel hopeless? No  Do you cry easily over simple problems? No  Activities of Daily Living In your present state of health, do you have any difficulty performing the following activities?:  Driving? No Managing money?  No Feeding yourself? No Getting from bed to chair? No Climbing a flight of stairs? No Preparing food and eating?: No Bathing or showering? No Getting dressed: No Getting to the toilet? No Using the toilet:No Moving around  from place to place: No In the past year have you fallen or had a near fall?:No   Are you sexually active?  Yes  Do you have more than one partner?  No  Hearing Difficulties: No Do you often ask people to speak up or repeat themselves? No Do you experience ringing or noises in your ears? No Do you have difficulty understanding soft or whispered voices? No   Do you feel that you have a problem with memory? No  Do you often misplace items? No  Do you feel safe at home?  No  Cognitive Testing  Alert? Yes  Normal Appearance?Yes  Oriented to person? Yes  Place? Yes   Time? Yes  Recall of three objects?  Yes  Can perform simple calculations? Yes  Displays appropriate judgment?Yes  Can read the correct time from a watch face?Yes   Advanced Directives have been discussed with the patient? Yes  List the Names of Other Physician/Practitioners you currently use: 1.    Indicate any recent Medical Services you may have received from other than [facility] providers in the past year (date may be approximate).  Immunization History  Administered Date(s) Administered  . Influenza Whole 11/05/2008  . Pneumococcal Polysaccharide 02/15/2009  . Td 01/04/2006    Screening Tests Health Maintenance  Topic Date Due  . Zostavax  06/18/2002  . Mammogram  07/06/2012  . Influenza Vaccine  10/16/2012  . Colonoscopy  10/17/2014  . Tetanus/tdap  01/05/2016  . Pneumococcal Polysaccharide Vaccine Age 46 And Over  Completed    All answers were reviewed with the patient and necessary referrals were made:  Trevionne Advani, MD   11/18/2011   History reviewed: allergies, current medications, past family history, past medical history, past social history, past surgical history and problem list  Review of Systems A comprehensive review of systems was negative.    Objective:     Vision by Snellen chart: right UJW:JXBJYNW declines measurement, left GNF:AOZHYQM declines measurement.  She does have an eye exam  this summer with her optometrist.  Body mass index is 22.86 kg/(m^2). BP 140/74  Pulse 72  Ht 5\' 2"  (1.575 m)  Wt 125 lb (56.7 kg)  BMI 22.86 kg/m2  BP 140/74  Pulse 72  Ht 5\' 2"  (1.575 m)  Wt 125 lb (56.7 kg)  BMI 22.86 kg/m2  General Appearance:    Alert, cooperative, no distress, appears stated age  Head:    Normocephalic, without obvious abnormality, atraumatic  Eyes:    PERRL, conjunctiva/corneas clear, EOM's intact,both eyes  Ears:    Normal TM's and external ear canals, both ears  Nose:   Nares normal, septum midline, mucosa normal, no drainage    or sinus tenderness  Throat:   Lips, mucosa, and tongue normal;  teeth and gums normal  Neck:   Supple, symmetrical, trachea midline, no adenopathy;    thyroid:  no enlargement/tenderness/nodules; no carotid   bruit or JVD  Back:     Symmetric, no curvature, ROM normal, no CVA tenderness  Lungs:     Clear to auscultation bilaterally, respirations unlabored  Chest Wall:    No tenderness or deformity   Heart:    Regular rate and rhythm, S1 and S2 normal, no murmur, rub   or gallop  Breast Exam:    No tenderness, masses, or nipple abnormality  Abdomen:     Soft, non-tender, bowel sounds active all four quadrants,    no masses, no organomegaly  Genitalia:    Not performed.   Rectal:    Not performed.   Extremities:   Extremities normal, atraumatic, no cyanosis or edema  Pulses:   2+ and symmetric all extremities  Skin:   Skin color, texture, turgor normal, no rashes or lesions  Lymph nodes:   Cervical, supraclavicular, and axillary nodes normal  Neurologic:   CNII-XII intact, gross motor normal       Assessment:     Annual Medicare Wellness Exam     Plan:     During the course of the visit the patient was educated and counseled about appropriate screening and preventive services including:    She has never been a smoker, but mom with hx of aortic aneurysm, abodminal  EKG shows rate of 49 bpm, no acute changes. Normal  axis.  No acute changes.    Hyperlipidemia-she's been off of her statin for one month. She would like to see what her cholesterol looks like off of the medication. Though it'll likely be high. I did go ahead and send a refill to the pharmacy.  Insomnia-she would like a refill on the Ambien. She says she only gets about 4 hours of sleep when she does take it. She does not use it every night.  She would like her hormones refilled as well. She says it helps with her sex drive as well as with her sleep. She's not willing to part with it yet. We did discuss increased risk of breast cancer and heart disease. She's opting to keep it for now. We will make sure her mammograms are up-to-date.  Not due for bone density until next year.  Diet review for nutrition referral? Yes ____  Not Indicated __x__   Patient Instructions (the written plan) was given to the patient.  Medicare Attestation I have personally reviewed: The patient's medical and social history Their use of alcohol, tobacco or illicit drugs Their current medications and supplements The patient's functional ability including ADLs,fall risks, home safety risks, cognitive, and hearing and visual impairment Diet and physical activities Evidence for depression or mood disorders  The patient's weight, height, BMI, and visual acuity have been recorded in the chart.  I have made referrals, counseling, and provided education to the patient based on review of the above and I have provided the patient with a written personalized care plan for preventive services.     Uyen Eichholz, MD   11/18/2011

## 2011-11-18 NOTE — Patient Instructions (Addendum)
Start a regular exercise program and make sure you are eating a healthy diet Try to eat 4 servings of dairy a day  Your vaccines are up to date.   

## 2011-11-19 ENCOUNTER — Encounter: Payer: Self-pay | Admitting: *Deleted

## 2011-11-19 DIAGNOSIS — Z23 Encounter for immunization: Secondary | ICD-10-CM

## 2011-11-24 ENCOUNTER — Ambulatory Visit (INDEPENDENT_AMBULATORY_CARE_PROVIDER_SITE_OTHER): Payer: 59

## 2011-11-24 DIAGNOSIS — Z1231 Encounter for screening mammogram for malignant neoplasm of breast: Secondary | ICD-10-CM

## 2011-11-25 ENCOUNTER — Ambulatory Visit: Payer: 59

## 2011-11-25 LAB — COMPLETE METABOLIC PANEL WITH GFR
ALT: 9 U/L (ref 0–35)
Alkaline Phosphatase: 75 U/L (ref 39–117)
CO2: 27 mEq/L (ref 19–32)
Calcium: 9.5 mg/dL (ref 8.4–10.5)
Chloride: 105 mEq/L (ref 96–112)
Sodium: 141 mEq/L (ref 135–145)

## 2011-11-25 LAB — LIPID PANEL
Triglycerides: 74 mg/dL (ref <150)
VLDL: 15 mg/dL (ref 0–40)

## 2011-11-26 ENCOUNTER — Other Ambulatory Visit: Payer: 59

## 2011-11-30 ENCOUNTER — Ambulatory Visit
Admission: RE | Admit: 2011-11-30 | Discharge: 2011-11-30 | Disposition: A | Discharge status: Home / Self Care | Payer: 59 | Source: Ambulatory Visit | Attending: Family Medicine | Admitting: Family Medicine

## 2011-11-30 ENCOUNTER — Ambulatory Visit: Payer: 59

## 2011-11-30 DIAGNOSIS — E785 Hyperlipidemia, unspecified: Secondary | ICD-10-CM

## 2011-11-30 DIAGNOSIS — Z8249 Family history of ischemic heart disease and other diseases of the circulatory system: Secondary | ICD-10-CM

## 2012-02-04 ENCOUNTER — Encounter: Payer: Self-pay | Admitting: Physician Assistant

## 2012-02-04 ENCOUNTER — Ambulatory Visit (INDEPENDENT_AMBULATORY_CARE_PROVIDER_SITE_OTHER): Payer: 59 | Admitting: Physician Assistant

## 2012-02-04 VITALS — BP 147/70 | HR 64 | Wt 124.0 lb

## 2012-02-04 DIAGNOSIS — S99929A Unspecified injury of unspecified foot, initial encounter: Secondary | ICD-10-CM

## 2012-02-04 DIAGNOSIS — S99922A Unspecified injury of left foot, initial encounter: Secondary | ICD-10-CM

## 2012-02-04 MED ORDER — HYDROCODONE-ACETAMINOPHEN 5-325 MG PO TABS: 1.0000 | ORAL_TABLET | Freq: Four times a day (QID) | ORAL | Status: DC | PRN

## 2012-02-04 MED ORDER — MELOXICAM 7.5 MG PO TABS: 7.5000 mg | ORAL_TABLET | Freq: Every day | ORAL | Status: DC

## 2012-02-04 NOTE — Progress Notes (Signed)
  Subjective:    Patient ID: Christine [name], female    DOB: [birth year], 71 y.o.   MRN: [id number]  HPI Patient is a 71 yo that presents to the clinic with a injury to her left 5th toe that happened 3 days ago. She was walking without shoes on and hit her toe the side of a chair. It was initally very painful and the pain has not resolved. The pain is worse when walking or putting any pressure on it but still hurts when just sitting. At its worse her pain is 8/10. She has tried [employer] and not working. She has not noticed any bruising.    Review of Systems     Objective:   Physical Exam  Constitutional: She is oriented to person, place, and time. She appears well-developed and well-nourished.  Musculoskeletal:       Pinpoint tenderness at the base of 5th toe of left foot and up joint line. Painful to stand or put any pressure on foot. NO tenderness to palpation of the dorsum or plantar foot except the location above.   Neurological: She is alert and oriented to person, place, and time.  Skin: Skin is warm and dry.       No bruising or erythema around left foot or toe. There was minimal swelling around the 5th toe of left foot.   Psychiatric: She has a normal mood and affect. Her behavior is normal.          Assessment & Plan:  Trauma to left 5th toe- discussed getting an xray to distinguish between fracture and if there was a possiblity of displaced fracture. Pt declined x-ray today and aware of the information lost by not having done. I did buddy wrap her last 2 toes and put her in a post-op boot to take the pressure off her toe and allow for rest. I gave her mobic to take for 2 weeks then as needed for pain along with norco for breakthrough pain. She is aware that bones take 6-8 weeks to heal from fracture. If not fracture then contusion can also take some time to improve. Discussed elevation, ice, and care for toe. Follow up in 2 weeks if not improving or 4 weeks if doing great.

## 2012-02-04 NOTE — Patient Instructions (Addendum)
REST, ICE, COMpress, Elevate. Use mobic every day for 2 weeks then as needed for pain. Use Vicodin as needed for breakthrough pain.   Follow up if not improving 2 weeks if improving 4 weeks. Wear boot for at least 4-6 weeks.   Crush Injury, Fingers or Toes A crush injury to the fingers or toes means the tissues have been damaged by being squeezed (compressed). There will be bleeding into the tissues and swelling. Often, blood will collect under the skin. When this happens, the skin on the finger often dies and may slough off (shed) 1 week to 10 days later. Usually, new skin is growing underneath. If the injury has been too severe and the tissue does not survive, the damaged tissue may begin to turn black over several days.  Wounds which occur because of the crushing may be stitched (sutured) shut. However, crush injuries are more likely to become infected than other injuries.These wounds may not be closed as tightly as other types of cuts to prevent infection. Nails involved are often lost. These usually grow back over several weeks.  DIAGNOSIS X-rays may be taken to see if there is any injury to the bones. TREATMENT Broken bones (fractures) may be treated with splinting, depending on the fracture. Often, no treatment is required for fractures of the last bone in the fingers or toes. HOME CARE INSTRUCTIONS   The crushed part should be raised (elevated) above the heart or center of the chest as much as possible for the first several days or as directed. This helps with pain and lessens swelling. Less swelling increases the chances that the crushed part will survive.  Put ice on the injured area.  Put ice in a plastic bag.  Place a towel between your skin and the bag.  Leave the ice on for 15 to 20 minutes, 3 to 4 times a day for the first 2 days.  Only take over-the-counter or prescription medicines for pain, discomfort, or fever as directed by your caregiver.  Use your injured part only as  directed.  Change your bandages (dressings) as directed.  Keep all follow-up appointments as directed by your caregiver. Not keeping your appointment could result in a chronic or permanent injury, pain, and disability. If there is any problem keeping the appointment, you must call to reschedule. SEEK IMMEDIATE MEDICAL CARE IF:   There is redness, swelling, or increasing pain in the wound area.  Pus is coming from the wound.  You have a fever.  You notice a bad smell coming from the wound or dressing.  The edges of the wound do not stay together after the sutures have been removed.  You are unable to move the injured finger or toe. MAKE SURE YOU:   Understand these instructions.  Will watch your condition.  Will get help right away if you are not doing well or get worse. Document Released: 02/01/2005 Document Revised: 04/26/2011 Document Reviewed: 06/19/2010 [facility] Patient Information 2013 [place], [place].

## 2012-04-18 ENCOUNTER — Telehealth: Payer: Self-pay | Admitting: Family Medicine

## 2012-04-18 ENCOUNTER — Other Ambulatory Visit: Payer: Self-pay | Admitting: *Deleted

## 2012-04-18 MED ORDER — ZOLPIDEM TARTRATE 10 MG PO TABS: 5.0000 mg | ORAL_TABLET | Freq: Every evening | ORAL | Status: DC | PRN

## 2012-04-18 NOTE — Telephone Encounter (Signed)
Refill on ambien.  New dose at 5mg .

## 2012-04-18 NOTE — Telephone Encounter (Signed)
Pt notified New rx sent 

## 2012-04-18 NOTE — Telephone Encounter (Signed)
Call pt: let her know next time we refill Remus Loffler will have to change to 5mg  dose but of new safety data showing inc drug levels in women. Dosing regimen has changed. Ok to send nre rx if needed.

## 2012-07-03 ENCOUNTER — Ambulatory Visit (INDEPENDENT_AMBULATORY_CARE_PROVIDER_SITE_OTHER): Payer: 59 | Admitting: Family Medicine

## 2012-07-03 ENCOUNTER — Encounter: Payer: Self-pay | Admitting: Family Medicine

## 2012-07-03 VITALS — BP 135/80 | HR 57 | Wt 117.0 lb

## 2012-07-03 DIAGNOSIS — J309 Allergic rhinitis, unspecified: Secondary | ICD-10-CM

## 2012-07-03 NOTE — Progress Notes (Signed)
  Subjective:    Patient ID: Christine [name], female    DOB: [birth year], 72 y.o.   MRN: [id number]  HPI 5 days of thick yellow sputum with cough. Started last Thursday.  Had scratchey throat initiallly. Felt like this last year. + Postnasal drip. ST is worse with cough. Gassy when coughing.  No blood in the sputum. She tried benadryl, mucinex with no relief. No dizziness with spells. No fever or nightsweats. No sick contacts. Has trouble swallowing pills.     Review of Systems     Objective:   Physical Exam  Constitutional: She is oriented to person, place, and time. She appears well-developed and well-nourished.  HENT:  Head: Normocephalic and atraumatic.  Right Ear: External ear normal.  Left Ear: External ear normal.  Nose: Nose normal.  Mouth/Throat: Oropharynx is clear and moist.  TMs and canals are clear.   Eyes: Conjunctivae and EOM are normal. Pupils are equal, round, and reactive to light.  Neck: Neck supple. No thyromegaly present.  Cardiovascular: Normal rate, regular rhythm and normal heart sounds.   Pulmonary/Chest: Effort normal and breath sounds normal. She has no wheezes.  Lymphadenopathy:    She has no cervical adenopathy.  Neurological: She is alert and oriented to person, place, and time.  Skin: Skin is warm and dry.  Psychiatric: She has a normal mood and affect.          Assessment & Plan:  Allergic rhinitis-her symptoms sound consistent with the postnasal drip with cough. Her lung exam is actually normal and reassuring today. I would like her try an over-the-counter antihistamine such as Zyrtec. If she suddenly gets worse or is not improving over the next couple days she's to call the office back. We could consider further evaluation as her pulse ox was down just a little bit today at 96%. The she doesn't really feel short of breath per se. Also offered to add a nasal steroid spray to help with postnasal drip but she declined. She really doesn't like anything  spread into the nose.

## 2012-07-03 NOTE — Patient Instructions (Addendum)
Zyrtec once day at bedtime.   Call if not getting better or if new symptoms or suddently getting worse. Allergic Rhinitis Allergic rhinitis is when the mucous membranes in the nose respond to allergens. Allergens are particles in the air that cause your body to have an allergic reaction. This causes you to release allergic antibodies. Through a chain of events, these eventually cause you to release histamine into the blood stream (hence the use of antihistamines). Although meant to be protective to the body, it is this release that causes your discomfort, such as frequent sneezing, congestion and an itchy runny nose.  CAUSES  The pollen allergens may come from grasses, trees, and weeds. This is seasonal allergic rhinitis, or "hay fever." Other allergens cause year-round allergic rhinitis (perennial allergic rhinitis) such as house dust mite allergen, pet dander and mold spores.  SYMPTOMS   Nasal stuffiness (congestion).  Runny, itchy nose with sneezing and tearing of the eyes.  There is often an itching of the mouth, eyes and ears. It cannot be cured, but it can be controlled with medications. DIAGNOSIS  If you are unable to determine the offending allergen, skin or blood testing may find it. TREATMENT   Avoid the allergen.  Medications and allergy shots (immunotherapy) can help.  Hay fever may often be treated with antihistamines in pill or nasal spray forms. Antihistamines block the effects of histamine. There are over-the-counter medicines that may help with nasal congestion and swelling around the eyes. Check with your caregiver before taking or giving this medicine. If the treatment above does not work, there are many new medications your caregiver can prescribe. Stronger medications may be used if initial measures are ineffective. Desensitizing injections can be used if medications and avoidance fails. Desensitization is when a patient is given ongoing shots until the body becomes less  sensitive to the allergen. Make sure you follow up with your caregiver if problems continue. SEEK MEDICAL CARE IF:   You develop fever (more than 100.5 F (38.1 C).  You develop a cough that does not stop easily (persistent).  You have shortness of breath.  You start wheezing.  Symptoms interfere with normal daily activities. Document Released: 10/27/2000 Document Revised: 04/26/2011 Document Reviewed: 05/08/2008 [facility] Patient Information 2013 [place], [place].

## 2012-07-13 ENCOUNTER — Ambulatory Visit (HOSPITAL_BASED_OUTPATIENT_CLINIC_OR_DEPARTMENT_OTHER)
Admission: RE | Admit: 2012-07-13 | Discharge: 2012-07-13 | Disposition: A | Discharge status: Home / Self Care | Payer: Medicare Other | Source: Ambulatory Visit | Attending: Family Medicine | Admitting: Family Medicine

## 2012-07-13 ENCOUNTER — Ambulatory Visit (INDEPENDENT_AMBULATORY_CARE_PROVIDER_SITE_OTHER): Payer: 59 | Admitting: Family Medicine

## 2012-07-13 ENCOUNTER — Encounter: Payer: Self-pay | Admitting: Family Medicine

## 2012-07-13 VITALS — BP 138/88 | HR 65 | Wt 118.0 lb

## 2012-07-13 DIAGNOSIS — M542 Cervicalgia: Secondary | ICD-10-CM

## 2012-07-13 DIAGNOSIS — R059 Cough, unspecified: Secondary | ICD-10-CM | POA: Insufficient documentation

## 2012-07-13 DIAGNOSIS — R05 Cough: Secondary | ICD-10-CM

## 2012-07-13 DIAGNOSIS — R070 Pain in throat: Secondary | ICD-10-CM | POA: Insufficient documentation

## 2012-07-13 DIAGNOSIS — J309 Allergic rhinitis, unspecified: Secondary | ICD-10-CM

## 2012-07-13 NOTE — Progress Notes (Signed)
  Subjective:    Patient ID: Christine [name], female    DOB: December 22, 1940, 72 y.o.   MRN: [id number]  HPI Seen for AR about a week ago.  Says her ST and post nasal drip is better on the antihistamine,  but her cough has actually gotten worse. Says feels like something is lodged in her windpipe on the right.  Doesn't feel itchy. No fever. Cough is mostly dry. No fever.  Started to gag this AM.  Never smoker. Talking makes it worse. She says she'll start to talk and then choking it at sharp pain in her throat and then feels like she can't breathe for a minute. She'll start coughing. She does feel like her cough is fairly deep and goes to the bottom of her lungs. She also feels it's very forceful. She has not vomited. No GI symptoms. No rash or diffuse itching. No new meds.    Review of Systems     Objective:   Physical Exam  Constitutional: She is oriented to person, place, and time. She appears well-developed and well-nourished.  HENT:  Head: Normocephalic and atraumatic.  Right Ear: External ear normal.  Left Ear: External ear normal.  Nose: Nose normal.  Mouth/Throat: Oropharynx is clear and moist.  TMs and canals are clear.   Eyes: Conjunctivae and EOM are normal. Pupils are equal, round, and reactive to light.  Neck: Neck supple. No thyromegaly present.  Cardiovascular: Normal rate, regular rhythm and normal heart sounds.   Pulmonary/Chest: Effort normal and breath sounds normal. She has no wheezes.  Lymphadenopathy:    She has no cervical adenopathy.  Neurological: She is alert and oriented to person, place, and time.  Skin: Skin is warm and dry.  Psychiatric: She has a normal mood and affect.          Assessment & Plan:  Cough - Will get CXR for further evaluation especially since she feels like her cough is very deep in her lungs. There is a throat sensation that seems to trigger the cough. I see no other signs of infection on exam today. She has never been a smoker.  Allergic  rhinitis-improved with the over-the-counter antihistamine. Her postnasal drip and sore throat is much improved.  Neck pain - Will refer to ENT. She describes a globus sensation of feeling like something is stuck in her windpipe and says it almost feels like it's hard to catch her breath at times. She doesn't remember aspirating but I think this is worrisome enough that she should see ENT fairly promptly to make sure that nothing is obstructing. Her pulse ox is down slightly at 96% today. She is mildly tender on the right side of her neck as well but I do not palpate any swollen lymph nodes.

## 2013-01-10 ENCOUNTER — Encounter: Payer: Medicare Other | Admitting: Family Medicine

## 2013-01-23 ENCOUNTER — Ambulatory Visit (INDEPENDENT_AMBULATORY_CARE_PROVIDER_SITE_OTHER): Payer: Medicare Other

## 2013-01-23 ENCOUNTER — Ambulatory Visit (INDEPENDENT_AMBULATORY_CARE_PROVIDER_SITE_OTHER): Payer: Medicare Other | Admitting: Family Medicine

## 2013-01-23 ENCOUNTER — Other Ambulatory Visit: Payer: Self-pay | Admitting: Family Medicine

## 2013-01-23 ENCOUNTER — Encounter: Payer: Self-pay | Admitting: Family Medicine

## 2013-01-23 VITALS — BP 131/74 | HR 57 | Temp 97.6°F | Ht 60.0 in | Wt 120.0 lb

## 2013-01-23 DIAGNOSIS — Z23 Encounter for immunization: Secondary | ICD-10-CM

## 2013-01-23 DIAGNOSIS — E785 Hyperlipidemia, unspecified: Secondary | ICD-10-CM

## 2013-01-23 DIAGNOSIS — Z780 Asymptomatic menopausal state: Secondary | ICD-10-CM

## 2013-01-23 DIAGNOSIS — Z1231 Encounter for screening mammogram for malignant neoplasm of breast: Secondary | ICD-10-CM

## 2013-01-23 DIAGNOSIS — Z0000 Encounter for general adult medical examination without abnormal findings: Secondary | ICD-10-CM

## 2013-01-23 DIAGNOSIS — R5381 Other malaise: Secondary | ICD-10-CM

## 2013-01-23 DIAGNOSIS — R413 Other amnesia: Secondary | ICD-10-CM

## 2013-01-23 DIAGNOSIS — M810 Age-related osteoporosis without current pathological fracture: Secondary | ICD-10-CM

## 2013-01-23 LAB — COMPLETE METABOLIC PANEL WITH GFR
ALT: 10 U/L (ref 0–35)
AST: 17 U/L (ref 0–37)
Albumin: 4.6 g/dL (ref 3.5–5.2)
CO2: 30 mEq/L (ref 19–32)
Calcium: 10 mg/dL (ref 8.4–10.5)
Chloride: 103 mEq/L (ref 96–112)
Creat: 0.78 mg/dL (ref 0.50–1.10)
GFR, Est African American: 89 mL/min
Potassium: 4.2 mEq/L (ref 3.5–5.3)
Total Protein: 7.7 g/dL (ref 6.0–8.3)

## 2013-01-23 LAB — CBC WITH DIFFERENTIAL/PLATELET
Basophils Absolute: 0.1 10*3/uL (ref 0.0–0.1)
Eosinophils Absolute: 0.3 10*3/uL (ref 0.0–0.7)
Eosinophils Relative: 6 % — ABNORMAL HIGH (ref 0–5)
HCT: 39.3 % (ref 36.0–46.0)
Hemoglobin: 13 g/dL (ref 12.0–15.0)
Lymphs Abs: 2.1 10*3/uL (ref 0.7–4.0)
MCH: 29.1 pg (ref 26.0–34.0)
MCHC: 33.1 g/dL (ref 30.0–36.0)
Monocytes Relative: 11 % (ref 3–12)
Neutrophils Relative %: 46 % (ref 43–77)
Platelets: 389 10*3/uL (ref 150–400)
RBC: 4.46 MIL/uL (ref 3.87–5.11)
RDW: 13.8 % (ref 11.5–15.5)
WBC: 5.8 10*3/uL (ref 4.0–10.5)

## 2013-01-23 LAB — TSH: TSH: 6.969 u[IU]/mL — ABNORMAL HIGH (ref 0.350–4.500)

## 2013-01-23 LAB — VITAMIN B12: Vitamin B-12: 636 pg/mL (ref 211–911)

## 2013-01-23 LAB — LIPID PANEL
LDL Cholesterol: 153 mg/dL — ABNORMAL HIGH (ref 0–99)
VLDL: 22 mg/dL (ref 0–40)

## 2013-01-23 LAB — FOLATE: Folate: 9.4 ng/mL

## 2013-01-23 MED ORDER — SERTRALINE HCL 50 MG PO TABS: 50.0000 mg | ORAL_TABLET | Freq: Every day | ORAL | Status: DC

## 2013-01-23 MED ORDER — ZOLPIDEM TARTRATE 10 MG PO TABS: 5.0000 mg | ORAL_TABLET | Freq: Every evening | ORAL | Status: DC | PRN

## 2013-01-23 NOTE — Progress Notes (Signed)
Subjective:    Christine [name] is a 72 y.o. female who presents for Medicare Annual/Subsequent preventive examination.  Preventive Screening-Counseling & Management  Tobacco History  Smoking status  . Never Smoker   Smokeless tobacco  . Not on file     Problems Prior to Visit 1.   Current Problems (verified) Patient Active Problem List   Diagnosis Date Noted  . Postmenopausal HRT (hormone replacement therapy) 06/17/2010  . PAIN IN JOINT PELVIC REGION AND THIGH 11/19/2008  . PARESTHESIA 04/08/2008  . HEADACHE 04/08/2008  . HYPERLIPIDEMIA 01/05/2006  . DISORDER, DEPRESSIVE NEC 01/04/2006  . OSTEOPENIA 11/23/2005  . INSOMNIA NOS 11/23/2005    Medications Prior to Visit Current Outpatient Prescriptions on File Prior to Visit  Medication Sig Dispense Refill  . AMBULATORY NON FORMULARY MEDICATION Medication Name: Zostavax IM x 1  1 vial  0  . sertraline (ZOLOFT) 50 MG tablet Take 1 tablet (50 mg total) by mouth daily. Take (1/2) tab daily  90 tablet  2  . zolpidem (AMBIEN) 10 MG tablet Take 0.5 tablets (5 mg total) by mouth at bedtime as needed for sleep.  30 tablet  3   No current facility-administered medications on file prior to visit.    Current Medications (verified) Current Outpatient Prescriptions  Medication Sig Dispense Refill  . AMBULATORY NON FORMULARY MEDICATION Medication Name: Zostavax IM x 1  1 vial  0  . sertraline (ZOLOFT) 50 MG tablet Take 1 tablet (50 mg total) by mouth daily. Take (1/2) tab daily  90 tablet  2  . zolpidem (AMBIEN) 10 MG tablet Take 0.5 tablets (5 mg total) by mouth at bedtime as needed for sleep.  30 tablet  3   No current facility-administered medications for this visit.     Allergies (verified) Review of patient's allergies indicates no known allergies.   PAST HISTORY  Family History Family History  Problem Relation Age of Onset  . Aneurysm Mother     abdominal  . Heart failure Mother   . Lung cancer Father     smoker     Social History History  Substance Use Topics  . Smoking status: Never Smoker   . Smokeless tobacco: Not on file  . Alcohol Use: No     Are there smokers in your home (other than you)? No  Risk Factors Current exercise habits: The patient does not participate in regular exercise at present.  Dietary issues discussed: None   Cardiac risk factors: advanced age (older than 75 for men, 65 for women) and sedentary lifestyle.  Depression Screen (Note: if answer to either of the following is "Yes", a more complete depression screening is indicated)   Over the past two weeks, have you felt down, depressed or hopeless? No  Over the past two weeks, have you felt little interest or pleasure in doing things? No  Have you lost interest or pleasure in daily life? No  Do you often feel hopeless? No  Do you cry easily over simple problems? No  Activities of Daily Living In your present state of health, do you have any difficulty performing the following activities?:  Driving? No Managing money?  No Feeding yourself? No Getting from bed to chair? No Climbing a flight of stairs? No Preparing food and eating?: No Bathing or showering? No Getting dressed: No Getting to the toilet? No Using the toilet:No Moving around from place to place: No In the past year have you fallen or had a near fall?:Yes, form loss of  balace when her sciatica flared   Are you sexually active?  No  Do you have more than one partner?  No  Hearing Difficulties: No Do you often ask people to speak up or repeat themselves? No Do you experience ringing or noises in your ears? No Do you have difficulty understanding soft or whispered voices? No   Do you feel that you have a problem with memory? Yes  Do you often misplace items? Yes  Do you feel safe at home?  Yes  Cognitive Testing  Alert? Yes  Normal Appearance?Yes  Oriented to person? Yes  Place? Yes   Time? Yes  Recall of three objects?  Yes  Can perform  simple calculations? Yes  Displays appropriate judgment?Yes  Can read the correct time from a watch face?not asked    Advanced Directives have been discussed with the patient? Yes  List the Names of Other Physician/Practitioners you currently use: 1.  Dr. Penni Bombard, sports medicine at [facility] orth[facility]  Indicate any recent Medical Services you may have received from other than [facility] providers in the past year (date may be approximate).  Immunization History  Administered Date(s) Administered  . Influenza Split 11/19/2011  . Influenza Whole 11/05/2008  . Pneumococcal Polysaccharide-23 02/15/2009  . Td 01/04/2006    Screening Tests Health Maintenance  Topic Date Due  . Influenza Vaccine  09/15/2012  . Mammogram  11/23/2013  . Colonoscopy  10/17/2014  . Tetanus/tdap  01/05/2016  . Pneumococcal Polysaccharide Vaccine Age 63 And Over  Completed  . Zostavax  Completed    All answers were reviewed with the patient and necessary referrals were made:  Ashlin Kreps, MD   01/23/2013   History reviewed: allergies, current medications, past family history, past medical history, past social history, past surgical history and problem list  Review of Systems A comprehensive review of systems was negative.    Objective:     Vision by Snellen chart: right eye: Dr. Rubye Oaks in 2013  There is no weight on file to calculate BMI. There were no vitals taken for this visit.  BP 131/74  Pulse 57  Temp(Src) 97.6 F (36.4 C)  Ht 5' (1.524 m)  Wt 120 lb (54.432 kg)  BMI 23.44 kg/m2  General Appearance:    Alert, cooperative, no distress, appears stated age  Head:    Normocephalic, without obvious abnormality, atraumatic  Eyes:    PERRL, conjunctiva/corneas clear, EOM's intact, fundi    benign, both eyes  Ears:    Normal TM's and external ear canals, both ears  Nose:   Nares normal, septum midline, mucosa normal, no drainage    or sinus tenderness  Throat:   Lips, mucosa, and tongue normal;  teeth and gums normal  Neck:   Supple, symmetrical, trachea midline, no adenopathy;    thyroid:  no enlargement/tenderness/nodules; no carotid   bruit or JVD  Back:     Symmetric, no curvature, ROM normal, no CVA tenderness  Lungs:     Clear to auscultation bilaterally, respirations unlabored  Chest Wall:    No tenderness or deformity   Heart:    Regular rate and rhythm, S1 and S2 normal, no murmur, rub   or gallop  Breast Exam:    No tenderness, masses, or nipple abnormality  Abdomen:     Soft, non-tender, bowel sounds active all four quadrants,    no masses, no organomegaly  Genitalia:    Normal female without lesion, discharge or tenderness  Rectal:    Normal  tone, normal prostate, no masses or tenderness;   guaiac negative stool  Extremities:   Extremities normal, atraumatic, no cyanosis or edema  Pulses:   2+ and symmetric all extremities  Skin:   Skin color, texture, turgor normal, no rashes or lesions  Lymph nodes:   Cervical, supraclavicular, and axillary nodes normal  Neurologic:   CNII-XII intact, normal strength, sensation and reflexes    throughout       Assessment:     Annual Medicare Wellness Exam      Plan:     During the course of the visit the patient was educated and counseled about appropriate screening and preventive services including:    Screening mammography  Bone densitometry screening  Advanced directives: has NO advanced directive - not interested in additional information. She has spoken with her children about such issues and feels comfortable with that.   Diet review for nutrition referral? Yes ____  Not Indicated _X_   Patient Instructions (the written plan) was given to the patient.  Medicare Attestation I have personally reviewed: The patient's medical and social history Their use of alcohol, tobacco or illicit drugs Their current medications and supplements The patient's functional ability including ADLs,fall risks, home safety  risks, cognitive, and hearing and visual impairment Diet and physical activities Evidence for depression or mood disorders  The patient's weight, height, BMI, and visual acuity have been recorded in the chart.  I have made referrals, counseling, and provided education to the patient based on review of the above and I have provided the patient with a written personalized care plan for preventive services.     Jeffrey Graefe, MD   01/23/2013

## 2013-01-23 NOTE — Patient Instructions (Signed)
Keep up a regular exercise program and make sure you are eating a healthy diet Try to eat 4 servings of dairy a day, or if you are lactose intolerant take a calcium with vitamin D daily.  Your vaccines are up to date.   

## 2013-01-24 LAB — T3, FREE: T3, Free: 2.4 pg/mL (ref 2.3–4.2)

## 2013-01-25 LAB — T4: T4, Total: 4.4 ug/dL — ABNORMAL LOW (ref 5.0–12.5)

## 2013-01-25 LAB — T3: T3, Total: 91.9 ng/dL (ref 80.0–204.0)

## 2013-02-26 ENCOUNTER — Telehealth: Payer: Self-pay | Admitting: *Deleted

## 2013-02-26 ENCOUNTER — Encounter: Payer: Self-pay | Admitting: Family Medicine

## 2013-02-26 ENCOUNTER — Ambulatory Visit (INDEPENDENT_AMBULATORY_CARE_PROVIDER_SITE_OTHER): Payer: 59 | Admitting: Family Medicine

## 2013-02-26 VITALS — BP 119/64 | HR 63 | Temp 97.6°F | Wt 122.0 lb

## 2013-02-26 DIAGNOSIS — E039 Hypothyroidism, unspecified: Secondary | ICD-10-CM

## 2013-02-26 DIAGNOSIS — M810 Age-related osteoporosis without current pathological fracture: Secondary | ICD-10-CM

## 2013-02-26 LAB — T3, FREE: T3, Free: 2.2 pg/mL — ABNORMAL LOW (ref 2.3–4.2)

## 2013-02-26 LAB — TSH: TSH: 44.634 u[IU]/mL — ABNORMAL HIGH (ref 0.350–4.500)

## 2013-02-26 LAB — T4, FREE: FREE T4: 0.47 ng/dL — AB (ref 0.80–1.80)

## 2013-02-26 NOTE — Telephone Encounter (Signed)
PA for Prolia has been denied. States pt must try oral medication. They will also be faxing the response.  Meyer CoryMisty Ahmad, LPN

## 2013-02-26 NOTE — Progress Notes (Signed)
   Subjective:    Patient ID: Christine [name], female    DOB: 01/01/1941, 73 y.o.   MRN: [id number]  HPI Abnormal thyroid tests-here to review labs. Her TSH came back mildly elevated and her free T4 was low. She's here to review the results. She has noticed some cold intolerance, constipation, skin and hair changes.  Osteoporosis-she's been fearful to try a bisphosphonate. She did pick up the prescription but never actually took it. She did do some research and Prolia and is interested in that particular drug. She did call her insurance it is covered but does require prior authorization. She did bring a somewhat 100 number to call. She is not currently taking any calcium or vitamin D supplement. Her last vitamin D level IV years ago was low. She is lactose intolerant.  Review of Systems     Objective:   Physical Exam  Constitutional: She is oriented to person, place, and time. She appears well-developed and well-nourished.  HENT:  Head: Normocephalic and atraumatic.  Neck: Thyromegaly present.  Mildly enlarged thyroid gland. Symmetric. No nodules.  Cardiovascular: Normal rate, regular rhythm and normal heart sounds.   Pulmonary/Chest: Effort normal and breath sounds normal.  Lymphadenopathy:    She has no cervical adenopathy.  Neurological: She is alert and oriented to person, place, and time.  Skin: Skin is warm and dry.  Psychiatric: She has a normal mood and affect. Her behavior is normal.          Assessment & Plan:  Abnormal thyroid tests-explained To her that based on her labs it looks like she may be hyperthyroid. This does run in her family. Her mother and sister have both been diagnosed with hypothyroidism. Explained her that we typically recheck the levels for confirmation before starting a treatment regimen since it is a hormone which can fluctuate. If repeat labs are consistent with hypothyroidism then we will start her on a low-dose thyroid hormone medication to supplement  her current production of thyroid hormone. I explained her that it does need fairly frequent testing at least initially until we find the right regimen and dose that works well for her. We'll repeat labs in 4-6 weeks if we end up starting a medication.  Osteoporosis-discussed the diagnosis. Encourage her to get adequate calcium and vitamin D intake. I gave her several over-the-counter suggestions for maintenance doses of calcium and vitamin D. She rarely eats dairy products. Also encourage regular exercise for bone strength. In addition to prescription medication. I explained her that we will go ahead and initiate a prior authorization for Prolia, but they may require that she has tried a generic bisphosphonate before they will cover it. I does want her to be aware of that. Due for repeat bone density in 2 years. Check vitamin D level.

## 2013-02-26 NOTE — Telephone Encounter (Signed)
Please call patient and let her. I think she actually did fill and pay for the prescription. I think she just didn't take it .so  If she wants to take it now for at least a month and try it we can do so.

## 2013-02-27 ENCOUNTER — Other Ambulatory Visit: Payer: Self-pay | Admitting: Family Medicine

## 2013-02-27 DIAGNOSIS — E039 Hypothyroidism, unspecified: Secondary | ICD-10-CM | POA: Insufficient documentation

## 2013-02-27 LAB — VITAMIN D 25 HYDROXY (VIT D DEFICIENCY, FRACTURES): Vit D, 25-Hydroxy: 11 ng/mL — ABNORMAL LOW (ref 30–89)

## 2013-02-27 MED ORDER — ALENDRONATE SODIUM 70 MG PO TABS: 70.0000 mg | ORAL_TABLET | ORAL | Status: DC

## 2013-02-27 MED ORDER — LEVOTHYROXINE SODIUM 50 MCG PO TABS: 50.0000 ug | ORAL_TABLET | Freq: Every day | ORAL | Status: DC

## 2013-02-27 MED ORDER — ALENDRONATE-CHOLECALCIFEROL 70-2800 MG-UNIT PO TABS: 1.0000 | ORAL_TABLET | ORAL | Status: DC

## 2013-02-27 NOTE — Telephone Encounter (Signed)
New rx sent

## 2013-02-27 NOTE — Progress Notes (Signed)
Change to generic Fosamax for cost reasons. The family became upset that Fosamax plus D. was cheaper but evidently it was not when it was processed by the pharmacist.

## 2013-02-27 NOTE — Telephone Encounter (Signed)
Called pt and she states it was over 1 year ago when she bought the Prolia so she would rather you call in an oral med in place of that for her to try.  Also went over lab results with her as well and [facility] Barry DienesKimberly Joann Jorge, LPN

## 2013-05-13 ENCOUNTER — Other Ambulatory Visit: Payer: Self-pay | Admitting: Family Medicine

## 2013-07-13 ENCOUNTER — Other Ambulatory Visit: Payer: Self-pay | Admitting: Family Medicine

## 2013-08-04 ENCOUNTER — Other Ambulatory Visit: Payer: Self-pay | Admitting: Family Medicine

## 2013-10-15 ENCOUNTER — Encounter: Payer: Self-pay | Admitting: Family Medicine

## 2013-10-15 ENCOUNTER — Ambulatory Visit (INDEPENDENT_AMBULATORY_CARE_PROVIDER_SITE_OTHER): Payer: 59 | Admitting: Family Medicine

## 2013-10-15 VITALS — BP 119/64 | HR 64 | Wt 122.0 lb

## 2013-10-15 DIAGNOSIS — G4700 Insomnia, unspecified: Secondary | ICD-10-CM

## 2013-10-15 DIAGNOSIS — E559 Vitamin D deficiency, unspecified: Secondary | ICD-10-CM | POA: Insufficient documentation

## 2013-10-15 DIAGNOSIS — E039 Hypothyroidism, unspecified: Secondary | ICD-10-CM

## 2013-10-15 DIAGNOSIS — Z23 Encounter for immunization: Secondary | ICD-10-CM

## 2013-10-15 DIAGNOSIS — R011 Cardiac murmur, unspecified: Secondary | ICD-10-CM

## 2013-10-15 MED ORDER — ZOLPIDEM TARTRATE 5 MG PO TABS: ORAL_TABLET | ORAL | Status: DC

## 2013-10-15 NOTE — Progress Notes (Signed)
Subjective:    Patient ID: Christine [name], female    DOB: 10-Mar-1940, 73 y.o.   MRN: [id number]  HPI Hypothyroidism-when I saw her in January her TSH was elevated at 44. She was started on levothyroxine in January. She is here for followup today. She was supposed to followup in 4-6 weeks but did not. She says she has been taking her meds consistantly about 1 hour before breakfast on an empty stomach. She really hasnt noticed any improvement in her fatigue. She has had some cold intolerance as welll   She also has vitamin D deficiency and was encouraged to start 2000 international units daily.  Insomnia-she needs a refill on her Ambien today. She takes half of a 10 mg tab. Only allows her to sleep about 3 hours but she feels like it's very helpful so would like to continue it. Review of Systems  BP 119/64  Pulse 64  Wt 122 lb (55.339 kg)    No Known Allergies  Past Medical History  Diagnosis Date  . Hyperlipidemia   . Depression   . Hypertriglyceridemia   . Osteopenia   . Insomnia     Past Surgical History  Procedure Laterality Date  . Abdominal hysterectomy  1991  . Cosmetic surgery  1997    liposuction    History   Social History  . Marital Status: Married    Spouse Name: Christine [name]    Number of Children: 2  . Years of Education: 13   Occupational History  . Still working    Social History Main Topics  . Smoking status: Never Smoker   . Smokeless tobacco: Not on file  . Alcohol Use: No  . Drug Use: No  . Sexual Activity:    Other Topics Concern  . Not on file   Social History Narrative   1 caffeine drink per day.  No regular exercise.    Family History  Problem Relation Age of Onset  . Aneurysm Mother     abdominal  . Heart failure Mother   . Lung cancer Father     smoker    Outpatient Encounter Prescriptions as of 10/15/2013  Medication Sig  . alendronate (FOSAMAX) 70 MG tablet Take 1 tablet (70 mg total) by mouth once a week. Take with a full  glass of water on an empty stomach.  . sertraline (ZOLOFT) 50 MG tablet Take 1 tablet (50 mg total) by mouth daily.  Marland Kitchen zolpidem (AMBIEN) 5 MG tablet take 1 tablet by mouth at bedtime if needed for sleep  . [DISCONTINUED] zolpidem (AMBIEN) 10 MG tablet take 1/2 tablet by mouth at bedtime if needed for sleep  . levothyroxine (SYNTHROID, LEVOTHROID) 50 MCG tablet take 1 tablet by mouth every morning BEFORE BREAKFAST          Objective:   Physical Exam  Constitutional: She is oriented to person, place, and time. She appears well-developed and well-nourished.  HENT:  Head: Normocephalic and atraumatic.  Neck: Neck supple. No thyromegaly present.  Cardiovascular: Normal rate and regular rhythm.   Murmur heard. 2/6 SEM best heart on the right side of chest.   Pulmonary/Chest: Effort normal and breath sounds normal.  Lymphadenopathy:    She has no cervical adenopathy.  Neurological: She is alert and oriented to person, place, and time.  Skin: Skin is warm and dry.  Psychiatric: She has a normal mood and affect. Her behavior is normal.          Assessment & Plan:  Hypothyroidism-will recheck levels today. Can adjust dose. Once we get her well regulated she still not feeling symptomatically better can always consider changing her to the brand Synthroid.  Vitamin D deficiency-due to recheck levels to see if her levels are increasing.  Insomnia-will refill her Ambien today. I did warn her about the fact that her Medicare may or may not pay for. She denies any excess sedation during the daytime. We could consider Belsomra in the future.  Murmur on exam today-will refer for echocardiogram. Interestingly she says that her mom and sister have a murmur and that her mom has a history of congestive heart failure.

## 2013-10-16 LAB — TSH: TSH: 0.603 u[IU]/mL (ref 0.350–4.500)

## 2013-10-16 LAB — VITAMIN D 25 HYDROXY (VIT D DEFICIENCY, FRACTURES): VIT D 25 HYDROXY: 21 ng/mL — AB (ref 30–89)

## 2013-10-16 LAB — T3, FREE: T3, Free: 2.8 pg/mL (ref 2.3–4.2)

## 2013-10-16 LAB — T4, FREE: Free T4: 0.84 ng/dL (ref 0.80–1.80)

## 2013-10-17 ENCOUNTER — Other Ambulatory Visit: Payer: Self-pay | Admitting: Family Medicine

## 2013-10-17 MED ORDER — SYNTHROID 50 MCG PO TABS: 50.0000 ug | ORAL_TABLET | Freq: Every day | ORAL | Status: DC

## 2013-11-13 ENCOUNTER — Telehealth: Payer: Self-pay | Admitting: *Deleted

## 2013-11-13 NOTE — Telephone Encounter (Signed)
To be scheduled today by Johnnye SimaPatricia or Jennifer. Corliss SkainsJamie Ali Mclaurin, CMA

## 2013-11-13 NOTE — Telephone Encounter (Signed)
Has this already been scheduled?

## 2013-11-13 NOTE — Telephone Encounter (Signed)
[facility] Echo stress [facility] contrast. [id number] expires 11/12/13-12/27/13. Corliss SkainsJamie Onyx Schirmer, CMA

## 2013-11-14 ENCOUNTER — Other Ambulatory Visit: Payer: Self-pay | Admitting: Family Medicine

## 2013-11-14 ENCOUNTER — Telehealth: Payer: Self-pay

## 2013-11-14 DIAGNOSIS — R011 Cardiac murmur, unspecified: Secondary | ICD-10-CM

## 2013-11-14 NOTE — Telephone Encounter (Signed)
Prior Auth for Echocardiogram without contrast. [id number]. 45 days- now until 12/29/2013

## 2013-11-15 ENCOUNTER — Ambulatory Visit (HOSPITAL_COMMUNITY): Payer: Medicare Other | Attending: Family Medicine | Admitting: Radiology

## 2013-11-15 ENCOUNTER — Other Ambulatory Visit (HOSPITAL_COMMUNITY): Payer: Self-pay | Admitting: Family Medicine

## 2013-11-15 ENCOUNTER — Other Ambulatory Visit (HOSPITAL_COMMUNITY): Payer: 59

## 2013-11-15 DIAGNOSIS — R011 Cardiac murmur, unspecified: Secondary | ICD-10-CM | POA: Insufficient documentation

## 2013-11-15 DIAGNOSIS — E785 Hyperlipidemia, unspecified: Secondary | ICD-10-CM | POA: Insufficient documentation

## 2013-11-15 DIAGNOSIS — R010 Benign and innocent cardiac murmurs: Secondary | ICD-10-CM

## 2013-11-15 NOTE — Progress Notes (Signed)
Echocardiogram performed.  

## 2014-02-19 ENCOUNTER — Other Ambulatory Visit: Payer: Self-pay | Admitting: Family Medicine

## 2014-03-27 ENCOUNTER — Ambulatory Visit (INDEPENDENT_AMBULATORY_CARE_PROVIDER_SITE_OTHER): Payer: Medicare Other

## 2014-03-27 ENCOUNTER — Encounter: Payer: Self-pay | Admitting: Family Medicine

## 2014-03-27 ENCOUNTER — Ambulatory Visit (INDEPENDENT_AMBULATORY_CARE_PROVIDER_SITE_OTHER): Payer: Medicare Other | Admitting: Family Medicine

## 2014-03-27 VITALS — BP 124/65 | HR 81 | Wt 121.0 lb

## 2014-03-27 DIAGNOSIS — G4700 Insomnia, unspecified: Secondary | ICD-10-CM

## 2014-03-27 DIAGNOSIS — H02843 Edema of right eye, unspecified eyelid: Secondary | ICD-10-CM

## 2014-03-27 DIAGNOSIS — M810 Age-related osteoporosis without current pathological fracture: Secondary | ICD-10-CM

## 2014-03-27 DIAGNOSIS — Z1231 Encounter for screening mammogram for malignant neoplasm of breast: Secondary | ICD-10-CM

## 2014-03-27 DIAGNOSIS — R232 Flushing: Secondary | ICD-10-CM

## 2014-03-27 DIAGNOSIS — Z0000 Encounter for general adult medical examination without abnormal findings: Secondary | ICD-10-CM

## 2014-03-27 DIAGNOSIS — N951 Menopausal and female climacteric states: Secondary | ICD-10-CM

## 2014-03-27 MED ORDER — SYNTHROID 50 MCG PO TABS: ORAL_TABLET | ORAL | Status: DC

## 2014-03-27 MED ORDER — ZOLPIDEM TARTRATE 5 MG PO TABS: ORAL_TABLET | ORAL | Status: DC

## 2014-03-27 MED ORDER — ERYTHROMYCIN 5 MG/GM OP OINT: 1.0000 "application " | TOPICAL_OINTMENT | Freq: Four times a day (QID) | OPHTHALMIC | Status: DC

## 2014-03-27 NOTE — Patient Instructions (Signed)
Dentist here in town  Drs Langston MaskerMorris and Ralph Leydenoyle  Keep up a regular exercise program and make sure you are eating a healthy diet Try to eat 4 servings of dairy a day, or if you are lactose intolerant take a calcium with vitamin D daily.  Your vaccines are up to date.

## 2014-03-27 NOTE — Progress Notes (Signed)
Subjective:    Christine [name] is a 74 y.o. female who presents for Medicare Annual/Subsequent preventive examination.  Preventive Screening-Counseling & Management  Tobacco History  Smoking status  . Never Smoker   Smokeless tobacco  . Not on file     Problems Prior to Visit 1. She does complain of some swelling around her eye, right for 3 days. She says it actually looks a little bit better today. Itches tender to touch especially over the cheekbone below her right eye. No fevers chills or sweats. No pain with extraocular movements. No recent vision changes. The she has a minimal wear her glasses because she is tender over her eye. She's been trying to do some warm compresses. No other medications or treatments. She doesn't remember getting in contact with something that may have caused an allergic reaction. She has had a little bit of goopy drainage in the morning but no significant crusting or discharge.  Current Problems (verified) Patient Active Problem List   Diagnosis Date Noted  . Unspecified vitamin D deficiency 10/15/2013  . Unspecified hypothyroidism 02/27/2013  . Postmenopausal HRT (hormone replacement therapy) 06/17/2010  . PAIN IN JOINT PELVIC REGION AND THIGH 11/19/2008  . PARESTHESIA 04/08/2008  . HEADACHE 04/08/2008  . HYPERLIPIDEMIA 01/05/2006  . DISORDER, DEPRESSIVE NEC 01/04/2006  . Osteoporosis 11/23/2005  . INSOMNIA NOS 11/23/2005    Medications Prior to Visit Current Outpatient Prescriptions on File Prior to Visit  Medication Sig Dispense Refill  . alendronate (FOSAMAX) 70 MG tablet Take 1 tablet (70 mg total) by mouth once a week. Take with a full glass of water on an empty stomach. 4 tablet 11   No current facility-administered medications on file prior to visit.    Current Medications (verified) Current Outpatient Prescriptions  Medication Sig Dispense Refill  . alendronate (FOSAMAX) 70 MG tablet Take 1 tablet (70 mg total) by mouth once a week.  Take with a full glass of water on an empty stomach. 4 tablet 11  . erythromycin ophthalmic ointment Place 1 application into the right eye 4 (four) times daily. X 1 week 3.5 g 0  . SYNTHROID 50 MCG tablet Take 1 tablet by mouth once daily 30 tablet 6  . zolpidem (AMBIEN) 5 MG tablet take 1 tablet by mouth at bedtime if needed for sleep 90 tablet 1   No current facility-administered medications for this visit.     Allergies (verified) Review of patient's allergies indicates no known allergies.   PAST HISTORY  Family History Family History  Problem Relation Age of Onset  . Aneurysm Mother     abdominal  . Heart failure Mother   . Lung cancer Father     smoker    Social History History  Substance Use Topics  . Smoking status: Never Smoker   . Smokeless tobacco: Not on file  . Alcohol Use: No     Are there smokers in your home (other than you)? No  Risk Factors Current exercise habits: The patient does not participate in regular exercise at present.  Dietary issues discussed: None   Cardiac risk factors: advanced age (older than 47 for men, 95 for women) and sedentary lifestyle.  Depression Screen (Note: if answer to either of the following is "Yes", a more complete depression screening is indicated)   Over the past two weeks, have you felt down, depressed or hopeless? No  Over the past two weeks, have you felt little interest or pleasure in doing things? No  Have you  lost interest or pleasure in daily life? No  Do you often feel hopeless? No  Do you cry easily over simple problems? No  Activities of Daily Living In your present state of health, do you have any difficulty performing the following activities?:  Driving? No Managing money?  No Feeding yourself? No Getting from bed to chair? No  Climbing a flight of stairs? No Preparing food and eating?: No Bathing or showering? No Getting dressed: No Getting to the toilet? No Using the toilet:No Moving around  from place to place: No In the past year have you fallen or had a near fall?:No   Are you sexually active?  Yes  Do you have more than one partner?  No  Hearing Difficulties: No Do you often ask people to speak up or repeat themselves? No Do you experience ringing or noises in your ears? No Do you have difficulty understanding soft or whispered voices? No   Do you feel that you have a problem with memory? No  Do you often misplace items? Yes  Do you feel safe at home?  Yes  Cognitive Testing  Alert? Yes  Normal Appearance?Yes  Oriented to person? Yes  Place? Yes   Time? Yes  Recall of three objects?  Yes  Can perform simple calculations? Yes  Displays appropriate judgment?Yes  Can read the correct time from a watch face?Yes   Advanced Directives have been discussed with the patient? Yes  List the Names of Other Physician/Practitioners you currently use: 1.    Indicate any recent Medical Services you may have received from other than [facility] providers in the past year (date may be approximate).  Immunization History  Administered Date(s) Administered  . Influenza Split 11/19/2011  . Influenza Whole 11/05/2008  . Influenza,inj,Quad PF,36+ Mos 01/23/2013, 10/15/2013  . Pneumococcal Polysaccharide-23 02/15/2009  . Td 01/04/2006    Screening Tests Health Maintenance  Topic Date Due  . INFLUENZA VACCINE  09/16/2014  . COLONOSCOPY  10/17/2014  . MAMMOGRAM  01/24/2015  . TETANUS/TDAP  01/05/2016  . DEXA SCAN  Completed  . PNEUMOCOCCAL POLYSACCHARIDE VACCINE AGE 86 AND OVER  Completed  . ZOSTAVAX  Completed    All answers were reviewed with the patient and necessary referrals were made:  METHENEY,CATHERINE, MD   03/27/2014   History reviewed: allergies, current medications, past family history, past medical history, past social history, past surgical history and problem list  Review of Systems A comprehensive review of systems was negative.    Objective:     Vision  by Snellen chart: right eye:20/50, left eye:20/40  There is no weight on file to calculate BMI. There were no vitals taken for this visit.  There were no vitals taken for this visit. General appearance: alert, cooperative and appears stated age Head: Normocephalic, without obvious abnormality, atraumatic Eyes: conj clear, EOMI, PEERLA Ears: normal TM's and external ear canals both ears Nose: Nares normal. Septum midline. Mucosa normal. No drainage or sinus tenderness. Throat: lips, mucosa, and tongue normal; teeth and gums normal Neck: no adenopathy, no carotid bruit, no JVD, supple, symmetrical, trachea midline and thyroid not enlarged, symmetric, no tenderness/mass/nodules Back: symmetric, no curvature. ROM normal. No CVA tenderness. Lungs: clear to auscultation bilaterally Breasts: normal appearance, no masses or tenderness Heart: regular rate and rhythm, S1, S2 normal, no murmur, click, rub or gallop Abdomen: soft, non-tender; bowel sounds normal; no masses,  no organomegaly Extremities: extremities normal, atraumatic, no cyanosis or edema Pulses: 2+ and symmetric Skin: Skin color,  texture, turgor normal. No rashes or lesions Lymph nodes: Cervical, supraclavicular, and axillary nodes normal. Neurologic: Alert and oriented X 3, normal strength and tone. Normal symmetric reflexes. Normal coordination and gait     Assessment:     Medicare Annual Wellness Exam       Plan:     During the course of the visit the patient was educated and counseled about appropriate screening and preventive services including:    Screening mammography  Bone densitometry screening  Colorectal cancer screening   Hypothyroidism-due to recheck TSH  Hot flashes-she still having some hot flashes at night even though she is 74 years old I would like to have her hormones tested. She still does not sleep well but she has had chronic insomnia since her early 63s.  Edema of right eye- unclear  etiology. Contact dermatitis versus early cellulitis though it seems to be resolving on its own so that is less likely. I'm going to go ahead and prescribe rifamycin ophthalmic ointment to apply. Recommend warm compresses and the morning to clean the lids and lashes and then cool compresses throughout the day to help with swelling. If it continues to improve over the next couple days and that is fantastic. If it does not, or suddenly gets worse then please call the office back. Diet review for nutrition referral? Yes ____  Not Indicated __X_   Patient Instructions (the written plan) was given to the patient.  Medicare Attestation I have personally reviewed: The patient's medical and social history Their use of alcohol, tobacco or illicit drugs Their current medications and supplements The patient's functional ability including ADLs,fall risks, home safety risks, cognitive, and hearing and visual impairment Diet and physical activities Evidence for depression or mood disorders  The patient's weight, height, BMI, and visual acuity have been recorded in the chart.  I have made referrals, counseling, and provided education to the patient based on review of the above and I have provided the patient with a written personalized care plan for preventive services.     METHENEY,CATHERINE, MD   03/27/2014

## 2014-03-28 LAB — COMPLETE METABOLIC PANEL WITH GFR
ALT: 9 U/L (ref 0–35)
AST: 14 U/L (ref 0–37)
Albumin: 4.2 g/dL (ref 3.5–5.2)
Alkaline Phosphatase: 71 U/L (ref 39–117)
BUN: 12 mg/dL (ref 6–23)
CALCIUM: 9.9 mg/dL (ref 8.4–10.5)
CHLORIDE: 103 meq/L (ref 96–112)
CO2: 28 mEq/L (ref 19–32)
Creat: 0.75 mg/dL (ref 0.50–1.10)
GFR, Est African American: >89 mL/min
GFR, Est Non African American: 80 mL/min
GLUCOSE: 81 mg/dL (ref 70–99)
POTASSIUM: 4.8 meq/L (ref 3.5–5.3)
SODIUM: 138 meq/L (ref 135–145)
TOTAL PROTEIN: 7.3 g/dL (ref 6.0–8.3)
Total Bilirubin: 0.6 mg/dL (ref 0.2–1.2)

## 2014-03-28 LAB — LIPID PANEL
CHOL/HDL RATIO: 4.4 ratio
CHOLESTEROL: 200 mg/dL (ref 0–200)
HDL: 45 mg/dL (ref >39)
LDL Cholesterol: 127 mg/dL — ABNORMAL HIGH (ref 0–99)
Triglycerides: 138 mg/dL (ref <150)
VLDL: 28 mg/dL (ref 0–40)

## 2014-03-28 LAB — LUTEINIZING HORMONE: LH: 41.5 m[IU]/mL

## 2014-03-28 LAB — FOLLICLE STIMULATING HORMONE: FSH: 81.1 m[IU]/mL

## 2014-03-28 LAB — ESTRADIOL: Estradiol: <11.8 pg/mL

## 2014-03-28 LAB — TSH: TSH: 3.934 u[IU]/mL (ref 0.350–4.500)

## 2014-03-28 LAB — PROGESTERONE: Progesterone: <0.2 ng/mL

## 2014-04-04 ENCOUNTER — Telehealth: Payer: Self-pay

## 2014-04-04 NOTE — Telephone Encounter (Signed)
Sent [place] through [employer] online for synthroid. Waiting on auth. - CF

## 2014-04-08 NOTE — Telephone Encounter (Signed)
Received fax for synthroid. The request was denied due to it is not covered under the medical benefit. - CF

## 2014-04-08 NOTE — Telephone Encounter (Signed)
I spoke with [name] and she would like for you to switch her synthroid to a generic. She stated that she paid for the synthroid out of pocket but would like to start the generic when it runs out. - CF

## 2014-04-08 NOTE — Telephone Encounter (Signed)
Please call patient her know that her insurance will not cover branded Synthroid. We put it through the prior authorization process and it was denied. We could certainly try switching her to a generic version for her to try for at least 30-60 days. She does have the right to appeal the decision if she would like. Worse she can consider paying out of pocket for the branded version.

## 2014-04-11 MED ORDER — LEVOTHYROXINE SODIUM 50 MCG PO TABS: 50.0000 ug | ORAL_TABLET | Freq: Every day | ORAL | Status: DC

## 2014-04-11 NOTE — Telephone Encounter (Signed)
Okay, I will go ahead and send a perception over to write 8. When she switches to the generic we will need to recheck her labs after about 4 weeks. There is a slight difference between the brand and generic but we can always adjust the dose if needed.

## 2014-04-18 NOTE — Telephone Encounter (Signed)
Patient states there was a mix up with the insurance. All is resolved.

## 2014-08-12 ENCOUNTER — Other Ambulatory Visit: Payer: Self-pay

## 2014-09-25 ENCOUNTER — Encounter: Payer: Self-pay | Admitting: Family Medicine

## 2014-09-25 ENCOUNTER — Ambulatory Visit (INDEPENDENT_AMBULATORY_CARE_PROVIDER_SITE_OTHER): Payer: Medicare Other | Admitting: Family Medicine

## 2014-09-25 VITALS — BP 137/63 | HR 52 | Ht 60.0 in | Wt 121.0 lb

## 2014-09-25 DIAGNOSIS — R51 Headache: Secondary | ICD-10-CM | POA: Diagnosis not present

## 2014-09-25 DIAGNOSIS — G4700 Insomnia, unspecified: Secondary | ICD-10-CM | POA: Diagnosis not present

## 2014-09-25 DIAGNOSIS — R519 Headache, unspecified: Secondary | ICD-10-CM

## 2014-09-25 DIAGNOSIS — Z1211 Encounter for screening for malignant neoplasm of colon: Secondary | ICD-10-CM

## 2014-09-25 DIAGNOSIS — Z23 Encounter for immunization: Secondary | ICD-10-CM | POA: Diagnosis not present

## 2014-09-25 MED ORDER — ZOLPIDEM TARTRATE 5 MG PO TABS: ORAL_TABLET | ORAL | Status: DC

## 2014-09-25 NOTE — Progress Notes (Signed)
   Subjective:    Patient ID: Christine [name], female    DOB: 04-Jul-1940, 74 y.o.   MRN: [id number]  HPI F/U insomnia- has had chronic insomnia since her early 58s. She is currently on Ambien 5 mg, and usually only takes a half.    She denies any S.E. Or excess sedation.    Has also been getting some intermittent headaches on the right side of her head just above the ear. She denies any trauma or injury or ear pain or pressure. No recent colds. She has been under a little bit more stress recently as her 88 year old husband has started to develop some memory problems and this is been somewhat stressful for her. She describes it as a moving sharp type sensation. It usually doesn't last long and she doesn't usually take medication for it. No recent neck pain.  Review of Systems     Objective:   Physical Exam  Constitutional: She is oriented to person, place, and time. She appears well-developed and well-nourished.  HENT:  Head: Normocephalic and atraumatic.  Right TM and canal is clear.   Cardiovascular: Normal rate, regular rhythm and normal heart sounds.   Pulmonary/Chest: Effort normal and breath sounds normal.  Neurological: She is alert and oriented to person, place, and time.  Skin: Skin is warm and dry.  Psychiatric: She has a normal mood and affect. Her behavior is normal.          Assessment & Plan:  Insomnia - will refill Ambien. She's doing well and typically takes half of a tab as needed.  Right sided headaches-gave reassurance. Exam is completely normal. Call fi getting more frequent or new sxs like vsion changes, etc.   Discussed need for Prevnar 13.    Discussed colon cancer screening and she will be due in September for her 10 year repeat screening colonoscopy. After discussing current options she says she will do that again but prefers to have [facility]. New referral placed.

## 2014-11-14 ENCOUNTER — Encounter: Payer: Self-pay | Admitting: Family Medicine

## 2014-12-23 ENCOUNTER — Ambulatory Visit (INDEPENDENT_AMBULATORY_CARE_PROVIDER_SITE_OTHER): Payer: Medicare Other | Admitting: Family Medicine

## 2014-12-23 ENCOUNTER — Encounter: Payer: Self-pay | Admitting: Family Medicine

## 2014-12-23 VITALS — BP 161/78 | HR 87 | Temp 98.6°F | Ht 60.0 in | Wt 123.9 lb

## 2014-12-23 DIAGNOSIS — J019 Acute sinusitis, unspecified: Secondary | ICD-10-CM

## 2014-12-23 DIAGNOSIS — R05 Cough: Secondary | ICD-10-CM

## 2014-12-23 DIAGNOSIS — R059 Cough, unspecified: Secondary | ICD-10-CM

## 2014-12-23 DIAGNOSIS — J209 Acute bronchitis, unspecified: Secondary | ICD-10-CM

## 2014-12-23 MED ORDER — AZITHROMYCIN 250 MG PO TABS: ORAL_TABLET | ORAL | Status: AC

## 2014-12-23 MED ORDER — HYDROCODONE-HOMATROPINE 5-1.5 MG/5ML PO SYRP: 5.0000 mL | ORAL_SOLUTION | Freq: Every evening | ORAL | Status: DC | PRN

## 2014-12-23 MED ORDER — ALBUTEROL SULFATE (2.5 MG/3ML) 0.083% IN NEBU
2.5000 mg | INHALATION_SOLUTION | Freq: Once | RESPIRATORY_TRACT | Status: AC
  Administered 2014-12-23: 2.5 mg via RESPIRATORY_TRACT

## 2014-12-23 NOTE — Progress Notes (Signed)
   Subjective:    Patient ID: Christine [name], female    DOB: 05/19/1940, 74 y.o.   MRN: [id number]  HPI Cough since last Tuesday ( 7 days ago) with yellow  Sputum. Over the weekend she started to feel more nasal congestion. She has been taking an over-the-counter decongested and cold medicine. She's also been taking Zyrtec for allergies. Patient is been more short of breath but no wheezing. She has a lot of excess postnasal drip which is making her gag especially at night. She's mostly getting clear mucus from her nose and yellow mucus from her chest. She has had a headache. No facial pressure or pain. She said she feels like she's been getting worse the last 2 days.   Review of Systems     Objective:   Physical Exam  Constitutional: She is oriented to person, place, and time. She appears well-developed and well-nourished.  HENT:  Head: Normocephalic and atraumatic.  Right Ear: External ear normal.  Left Ear: External ear normal.  Nose: Nose normal.  Mouth/Throat: Oropharynx is clear and moist.  TMs and canals are clear.   Eyes: Conjunctivae and EOM are normal. Pupils are equal, round, and reactive to light.  Neck: Neck supple. No thyromegaly present.  Cardiovascular: Normal rate, regular rhythm and normal heart sounds.   Pulmonary/Chest: Effort normal and breath sounds normal. She has no wheezes.  Lymphadenopathy:    She has no cervical adenopathy.  Neurological: She is alert and oriented to person, place, and time.  Skin: Skin is warm and dry.  Psychiatric: She has a normal mood and affect.          Assessment & Plan:   acute sinusitis/bronchitis-she's had symptoms for 7 days and feels like she is actually getting worse. We'll go ahead and treat with azithromycin as well as per prescription cough medication. Did give her sample albuterol since she did seem to respond well to that here in the office today. Call if getting worse or not better in one week.

## 2014-12-23 NOTE — Patient Instructions (Signed)
Sinusitis, Adult °Sinusitis is redness, soreness, and inflammation of the paranasal sinuses. Paranasal sinuses are air pockets within the bones of your face. They are located beneath your eyes, in the middle of your forehead, and above your eyes. In healthy paranasal sinuses, mucus is able to drain out, and air is able to circulate through them by way of your nose. However, when your paranasal sinuses are inflamed, mucus and air can become trapped. This can allow bacteria and other germs to grow and cause infection. °Sinusitis can develop quickly and last only a short time (acute) or continue over a long period (chronic). Sinusitis that lasts for more than 12 weeks is considered chronic. °CAUSES °Causes of sinusitis include: °· Allergies. °· Structural abnormalities, such as displacement of the cartilage that separates your nostrils (deviated septum), which can decrease the air flow through your nose and sinuses and affect sinus drainage. °· Functional abnormalities, such as when the small hairs (cilia) that line your sinuses and help remove mucus do not work properly or are not present. °SIGNS AND SYMPTOMS °Symptoms of acute and chronic sinusitis are the same. The primary symptoms are pain and pressure around the affected sinuses. Other symptoms include: °· Upper toothache. °· Earache. °· Headache. °· Bad breath. °· Decreased sense of smell and taste. °· A cough, which worsens when you are lying flat. °· Fatigue. °· Fever. °· Thick drainage from your nose, which often is green and may contain pus (purulent). °· Swelling and warmth over the affected sinuses. °DIAGNOSIS °Your health care provider will perform a physical exam. During your exam, your health care provider may perform any of the following to help determine if you have acute sinusitis or chronic sinusitis: °· Look in your nose for signs of abnormal growths in your nostrils (nasal polyps). °· Tap over the affected sinus to check for signs of  infection. °· View the inside of your sinuses using an imaging device that has a light attached (endoscope). °If your health care provider suspects that you have chronic sinusitis, one or more of the following tests may be recommended: °· Allergy tests. °· Nasal culture. A sample of mucus is taken from your nose, sent to a lab, and screened for bacteria. °· Nasal cytology. A sample of mucus is taken from your nose and examined by your health care provider to determine if your sinusitis is related to an allergy. °TREATMENT °Most cases of acute sinusitis are related to a viral infection and will resolve on their own within 10 days. Sometimes, medicines are prescribed to help relieve symptoms of both acute and chronic sinusitis. These may include pain medicines, decongestants, nasal steroid sprays, or saline sprays. °However, for sinusitis related to a bacterial infection, your health care provider will prescribe antibiotic medicines. These are medicines that will help kill the bacteria causing the infection. °Rarely, sinusitis is caused by a fungal infection. In these cases, your health care provider will prescribe antifungal medicine. °For some cases of chronic sinusitis, surgery is needed. Generally, these are cases in which sinusitis recurs more than 3 times per year, despite other treatments. °HOME CARE INSTRUCTIONS °· Drink plenty of water. Water helps thin the mucus so your sinuses can drain more easily. °· Use a humidifier. °· Inhale steam 3-4 times a day (for example, sit in the bathroom with the shower running). °· Apply a warm, moist washcloth to your face 3-4 times a day, or as directed by your health care provider. °· Use saline nasal sprays to help   moisten and clean your sinuses. °· Take medicines only as directed by your health care provider. °· If you were prescribed either an antibiotic or antifungal medicine, finish it all even if you start to feel better. °SEEK IMMEDIATE MEDICAL CARE IF: °· You have  increasing pain or severe headaches. °· You have nausea, vomiting, or drowsiness. °· You have swelling around your face. °· You have vision problems. °· You have a stiff neck. °· You have difficulty breathing. °  °This information is not intended to replace advice given to you by your health care provider. Make sure you discuss any questions you have with your health care provider. °  °Document Released: 02/01/2005 Document Revised: 02/22/2014 Document Reviewed: 02/16/2011 °Elsevier Interactive Patient Education ©2016 [employer] ° °Acute Bronchitis °Bronchitis is inflammation of the airways that extend from the windpipe into the lungs (bronchi). The inflammation often causes mucus to develop. This leads to a cough, which is the most common symptom of bronchitis.  °In acute bronchitis, the condition usually develops suddenly and goes away over time, usually in a couple weeks. Smoking, allergies, and asthma can make bronchitis worse. Repeated episodes of bronchitis may cause further lung problems.  °CAUSES °Acute bronchitis is most often caused by the same virus that causes a cold. The virus can spread from person to person (contagious) through coughing, sneezing, and touching contaminated objects. °SIGNS AND SYMPTOMS  °· Cough.   °· Fever.   °· Coughing up mucus.   °· Body aches.   °· Chest congestion.   °· Chills.   °· Shortness of breath.   °· Sore throat.   °DIAGNOSIS  °Acute bronchitis is usually diagnosed through a physical exam. Your health care provider will also ask you questions about your medical history. Tests, such as chest X-rays, are sometimes done to rule out other conditions.  °TREATMENT  °Acute bronchitis usually goes away in a couple weeks. Oftentimes, no medical treatment is necessary. Medicines are sometimes given for relief of fever or cough. Antibiotic medicines are usually not needed but may be prescribed in certain situations. In some cases, an inhaler may be recommended to help reduce  shortness of breath and control the cough. A cool mist vaporizer may also be used to help thin bronchial secretions and make it easier to clear the chest.  °HOME CARE INSTRUCTIONS °· Get plenty of rest.   °· Drink enough fluids to keep your urine clear or pale yellow (unless you have a medical condition that requires fluid restriction). Increasing fluids may help thin your respiratory secretions (sputum) and reduce chest congestion, and it will prevent dehydration.   °· Take medicines only as directed by your health care provider. °· If you were prescribed an antibiotic medicine, finish it all even if you start to feel better. °· Avoid smoking and secondhand smoke. Exposure to cigarette smoke or irritating chemicals will make bronchitis worse. If you are a smoker, consider using nicotine gum or skin patches to help control withdrawal symptoms. Quitting smoking will help your lungs heal faster.   °· Reduce the chances of another bout of acute bronchitis by washing your hands frequently, avoiding people with cold symptoms, and trying not to touch your hands to your mouth, nose, or eyes.   °· Keep all follow-up visits as directed by your health care provider.   °SEEK MEDICAL CARE IF: °Your symptoms do not improve after 1 week of treatment.  °SEEK IMMEDIATE MEDICAL CARE IF: °· You develop an increased fever or chills.   °· You have chest pain.   °· You have severe shortness of   breath. °· You have bloody sputum.   °· You develop dehydration. °· You faint or repeatedly feel like you are going to pass out. °· You develop repeated vomiting. °· You develop a severe headache. °MAKE SURE YOU:  °· Understand these instructions. °· Will watch your condition. °· Will get help right away if you are not doing well or get worse. °  °This information is not intended to replace advice given to you by your health care provider. Make sure you discuss any questions you have with your health care provider. °  °Document Released:  03/11/2004 Document Revised: 02/22/2014 Document Reviewed: 07/25/2012 °Elsevier Interactive Patient Education ©2016 [employer] ° °

## 2014-12-26 ENCOUNTER — Encounter: Payer: Self-pay | Admitting: Family Medicine

## 2015-04-01 ENCOUNTER — Other Ambulatory Visit: Payer: Self-pay | Admitting: Family Medicine

## 2015-04-01 ENCOUNTER — Ambulatory Visit (INDEPENDENT_AMBULATORY_CARE_PROVIDER_SITE_OTHER): Payer: Medicare Other | Admitting: Family Medicine

## 2015-04-01 ENCOUNTER — Encounter: Payer: Self-pay | Admitting: Family Medicine

## 2015-04-01 VITALS — BP 136/70 | HR 61 | Ht 60.0 in | Wt 120.3 lb

## 2015-04-01 DIAGNOSIS — E785 Hyperlipidemia, unspecified: Secondary | ICD-10-CM | POA: Diagnosis not present

## 2015-04-01 DIAGNOSIS — M810 Age-related osteoporosis without current pathological fracture: Secondary | ICD-10-CM

## 2015-04-01 DIAGNOSIS — E559 Vitamin D deficiency, unspecified: Secondary | ICD-10-CM

## 2015-04-01 DIAGNOSIS — E039 Hypothyroidism, unspecified: Secondary | ICD-10-CM

## 2015-04-01 DIAGNOSIS — Z1231 Encounter for screening mammogram for malignant neoplasm of breast: Secondary | ICD-10-CM

## 2015-04-01 DIAGNOSIS — R5383 Other fatigue: Secondary | ICD-10-CM

## 2015-04-01 DIAGNOSIS — Z0000 Encounter for general adult medical examination without abnormal findings: Secondary | ICD-10-CM | POA: Diagnosis not present

## 2015-04-01 DIAGNOSIS — G4700 Insomnia, unspecified: Secondary | ICD-10-CM

## 2015-04-01 LAB — LIPID PANEL
Cholesterol: 216 mg/dL — ABNORMAL HIGH (ref 125–200)
HDL: 41 mg/dL — ABNORMAL LOW (ref >=46)
LDL Cholesterol: 153 mg/dL — ABNORMAL HIGH (ref <130)
TRIGLYCERIDES: 110 mg/dL (ref <150)
Total CHOL/HDL Ratio: 5.3 Ratio — ABNORMAL HIGH (ref <=5.0)
VLDL: 22 mg/dL (ref <30)

## 2015-04-01 LAB — COMPLETE METABOLIC PANEL WITH GFR
ALBUMIN: 4.2 g/dL (ref 3.6–5.1)
ALT: 10 U/L (ref 6–29)
AST: 17 U/L (ref 10–35)
Alkaline Phosphatase: 92 U/L (ref 33–130)
BILIRUBIN TOTAL: 0.6 mg/dL (ref 0.2–1.2)
BUN: 12 mg/dL (ref 7–25)
CALCIUM: 9.9 mg/dL (ref 8.6–10.4)
CO2: 25 mmol/L (ref 20–31)
CREATININE: 0.79 mg/dL (ref 0.60–0.93)
Chloride: 102 mmol/L (ref 98–110)
GFR, EST AFRICAN AMERICAN: 86 mL/min (ref >=60)
GFR, Est Non African American: 75 mL/min (ref >=60)
Glucose, Bld: 79 mg/dL (ref 65–99)
Potassium: 4.3 mmol/L (ref 3.5–5.3)
Sodium: 140 mmol/L (ref 135–146)
TOTAL PROTEIN: 7.1 g/dL (ref 6.1–8.1)

## 2015-04-01 LAB — FERRITIN: Ferritin: 174 ng/mL (ref 20–288)

## 2015-04-01 LAB — CBC WITH DIFFERENTIAL/PLATELET
BASOS PCT: 1 % (ref 0–1)
Basophils Absolute: 0.1 10*3/uL (ref 0.0–0.1)
EOS PCT: 5 % (ref 0–5)
Eosinophils Absolute: 0.3 10*3/uL (ref 0.0–0.7)
HEMATOCRIT: 39 % (ref 36.0–46.0)
Hemoglobin: 12.7 g/dL (ref 12.0–15.0)
Lymphocytes Relative: 36 % (ref 12–46)
Lymphs Abs: 2.1 10*3/uL (ref 0.7–4.0)
MCH: 29.5 pg (ref 26.0–34.0)
MCHC: 32.6 g/dL (ref 30.0–36.0)
MCV: 90.5 fL (ref 78.0–100.0)
MONO ABS: 0.6 10*3/uL (ref 0.1–1.0)
MONOS PCT: 10 % (ref 3–12)
MPV: 10.5 fL (ref 8.6–12.4)
Neutro Abs: 2.7 10*3/uL (ref 1.7–7.7)
Neutrophils Relative %: 48 % (ref 43–77)
Platelets: 378 10*3/uL (ref 150–400)
RBC: 4.31 MIL/uL (ref 3.87–5.11)
RDW: 14 % (ref 11.5–15.5)
WBC: 5.7 10*3/uL (ref 4.0–10.5)

## 2015-04-01 LAB — TSH: TSH: 2.88 mIU/L

## 2015-04-01 MED ORDER — ZOLPIDEM TARTRATE 5 MG PO TABS: ORAL_TABLET | ORAL | Status: DC

## 2015-04-01 MED ORDER — SYNTHROID 50 MCG PO TABS: 50.0000 ug | ORAL_TABLET | Freq: Every day | ORAL | Status: DC

## 2015-04-01 NOTE — Patient Instructions (Addendum)
Can start Caltrate D for you osteoporosis.

## 2015-04-01 NOTE — Progress Notes (Signed)
Subjective:    Christine [name] is a 75 y.o. female who presents for Medicare Annual/Subsequent preventive examination.  Preventive Screening-Counseling & Management  Tobacco History  Smoking status  . Never Smoker   Smokeless tobacco  . Not on file     Problems Prior to Visit 1. She complains that she sought a little bit more fatigued over the last year or so. She says she gets a little bit more winded with activities and has had friends point now on the foam that she seems a little bit more short of breath. She denies any chest pain. She's not currently getting regular exercise. She denies any swelling of the extremities. No cough. No fevers chills or night sweats.  Current Problems (verified) Patient Active Problem List   Diagnosis Date Noted  . Vitamin D deficiency 10/15/2013  . Hypothyroidism 02/27/2013  . Postmenopausal HRT (hormone replacement therapy) 06/17/2010  . PAIN IN JOINT PELVIC REGION AND THIGH 11/19/2008  . PARESTHESIA 04/08/2008  . HEADACHE 04/08/2008  . Hyperlipidemia 01/05/2006  . DISORDER, DEPRESSIVE NEC 01/04/2006  . Osteoporosis 11/23/2005  . Insomnia 11/23/2005    Medications Prior to Visit No current outpatient prescriptions on file prior to visit.   No current facility-administered medications on file prior to visit.    Current Medications (verified) Current Outpatient Prescriptions  Medication Sig Dispense Refill  . zolpidem (AMBIEN) 5 MG tablet take 1 tablet by mouth at bedtime if needed for sleep 90 tablet 1  . SYNTHROID 50 MCG tablet Take 1 tablet (50 mcg total) by mouth daily before breakfast. 90 tablet 3   No current facility-administered medications for this visit.     Allergies (verified) Review of patient's allergies indicates no known allergies.   PAST HISTORY  Family History Family History  Problem Relation Age of Onset  . Aneurysm Mother     abdominal  . Heart failure Mother   . Lung cancer Father     smoker     Social History Social History  Substance Use Topics  . Smoking status: Never Smoker   . Smokeless tobacco: Not on file  . Alcohol Use: No     Are there smokers in your home (other than you)? No  Risk Factors Current exercise habits: some  Dietary issues discussed: None   Cardiac risk factors: advanced age (older than 49 for men, 34 for women).  Depression Screen (Note: if answer to either of the following is "Yes", a more complete depression screening is indicated)   Over the past two weeks, have you felt down, depressed or hopeless? No  Over the past two weeks, have you felt little interest or pleasure in doing things? No  Have you lost interest or pleasure in daily life? No  Do you often feel hopeless? No  Do you cry easily over simple problems? No  Activities of Daily Living In your present state of health, do you have any difficulty performing the following activities?:  Driving? No Managing money?  No Feeding yourself? No Getting from bed to chair? No Climbing a flight of stairs? No Preparing food and eating?: No Bathing or showering? No Getting dressed: No Getting to the toilet? No Using the toilet:No Moving around from place to place: No In the past year have you fallen or had a near fall?:No   Are you sexually active?  Yes  Do you have more than one partner?  Yes  Hearing Difficulties: No Do you often ask people to speak up or repeat themselves?  No Do you experience ringing or noises in your ears? No Do you have difficulty understanding soft or whispered voices? No   Do you feel that you have a problem with memory? No  Do you often misplace items? No  Do you feel safe at home?  Yes  Cognitive Testing  Alert? Yes  Normal Appearance?Yes  Oriented to person? Yes  Place? Yes   Time? Yes  Recall of three objects?  Yes  Can perform simple calculations? Yes  Displays appropriate judgment?Yes  Can read the correct time from a watch face?Yes   Advanced  Directives have been discussed with the patient? Yes  List the Names of Other Physician/Practitioners you currently use: 1.    Indicate any recent Medical Services you may have received from other than [facility] providers in the past year (date may be approximate).  Immunization History  Administered Date(s) Administered  . Influenza Split 11/19/2011  . Influenza Whole 11/05/2008  . Influenza, High Dose Seasonal PF 11/20/2014, 11/21/2014  . Influenza,inj,Quad PF,36+ Mos 01/23/2013, 10/15/2013  . Pneumococcal Conjugate-13 09/25/2014  . Pneumococcal Polysaccharide-23 02/15/2009  . Td 01/04/2006    Screening Tests Health Maintenance  Topic Date Due  . INFLUENZA VACCINE  09/16/2015  . TETANUS/TDAP  01/05/2016  . MAMMOGRAM  03/27/2016  . COLONOSCOPY  10/28/2024  . DEXA SCAN  Completed  . ZOSTAVAX  Completed  . PNA vac Low Risk Adult  Completed    All answers were reviewed with the patient and necessary referrals were made:  Zygmund Passero, MD   04/01/2015   History reviewed: allergies, current medications, past family history, past medical history, past social history, past surgical history and problem list  Review of Systems A comprehensive review of systems was negative.    Objective:     Vision by Snellen chart: REfere for eye exam   Body mass index is 23.49 kg/(m^2). BP 136/70 mmHg  Pulse 61  Ht 5' (1.524 m)  Wt 120 lb 4.8 oz (54.568 kg)  BMI 23.49 kg/m2  SpO2 100%  BP 136/70 mmHg  Pulse 61  Ht 5' (1.524 m)  Wt 120 lb 4.8 oz (54.568 kg)  BMI 23.49 kg/m2  SpO2 100% General appearance: alert, cooperative and appears stated age Head: Normocephalic, without obvious abnormality, atraumatic Eyes: conj clear, EOMI, pEERLA Ears: normal TM's and external ear canals both ears Nose: Nares normal. Septum midline. Mucosa normal. No drainage or sinus tenderness. Throat: lips, mucosa, and tongue normal; teeth and gums normal Neck: no adenopathy, no carotid bruit, no JVD,  supple, symmetrical, trachea midline and thyroid not enlarged, symmetric, no tenderness/mass/nodules Back: symmetric, no curvature. ROM normal. No CVA tenderness. Lungs: clear to auscultation bilaterally Breasts: normal appearance, no masses or tenderness Heart: regular rate and rhythm, S1, S2 normal, no murmur, click, rub or gallop Abdomen: soft, non-tender; bowel sounds normal; no masses,  no organomegaly Extremities: extremities normal, atraumatic, no cyanosis or edema Pulses: 2+ and symmetric Skin: Skin color, texture, turgor normal. No rashes or lesions Lymph nodes: Cervical, supraclavicular, and axillary nodes normal. Neurologic: Alert and oriented X 3, normal strength and tone. Normal symmetric reflexes. Normal coordination and gait     Assessment:     Medicare Wellness EXam       Plan:     During the course of the visit the patient was educated and counseled about appropriate screening and preventive services including:     Screening mammography  vaccines are UTD   Hypothyroidism-recheck TSH. Refills sent to the pharmacy.  Insomnia-refill sent for Ambien. Follow-up in 6 months.  The unclear etiology at this point. We'll do some additional blood work to rule out thyroid disorder and anemia. Did encourage her to start a regular exercise regimen. She was worried about potential for congestive heart failure as her mother had this diagnosis but we did an echocardiogram on her a little over a year ago and it was completely normal except for some mild tricuspid regurgitation. He recent edema or swelling.  Diet review for nutrition referral? Yes ____  Not Indicated _x__   Patient Instructions (the written plan) was given to the patient.  Medicare Attestation I have personally reviewed: The patient's medical and social history Their use of alcohol, tobacco or illicit drugs Their current medications and supplements The patient's functional ability including ADLs,fall risks,  home safety risks, cognitive, and hearing and visual impairment Diet and physical activities Evidence for depression or mood disorders  The patient's weight, height, BMI, and visual acuity have been recorded in the chart.  I have made referrals, counseling, and provided education to the patient based on review of the above and I have provided the patient with a written personalized care plan for preventive services.     Chaunce Winkels, MD   04/01/2015

## 2015-04-02 ENCOUNTER — Ambulatory Visit (INDEPENDENT_AMBULATORY_CARE_PROVIDER_SITE_OTHER): Payer: Medicare Other

## 2015-04-02 DIAGNOSIS — Z1231 Encounter for screening mammogram for malignant neoplasm of breast: Secondary | ICD-10-CM

## 2015-04-02 DIAGNOSIS — M859 Disorder of bone density and structure, unspecified: Secondary | ICD-10-CM

## 2015-04-02 DIAGNOSIS — M810 Age-related osteoporosis without current pathological fracture: Secondary | ICD-10-CM

## 2015-04-02 LAB — VITAMIN D 25 HYDROXY (VIT D DEFICIENCY, FRACTURES): Vit D, 25-Hydroxy: 11 ng/mL — ABNORMAL LOW (ref 30–100)

## 2015-04-02 MED ORDER — ATORVASTATIN CALCIUM 10 MG PO TABS: 10.0000 mg | ORAL_TABLET | Freq: Every day | ORAL | Status: DC

## 2015-04-02 MED ORDER — IBANDRONATE SODIUM 150 MG PO TABS: 150.0000 mg | ORAL_TABLET | ORAL | Status: DC

## 2015-04-02 NOTE — Addendum Note (Signed)
Addended by: Deno Etienne on: 04/02/2015 05:16 PM   Modules accepted: Orders

## 2015-04-17 ENCOUNTER — Telehealth: Payer: Self-pay | Admitting: *Deleted

## 2015-04-17 NOTE — Telephone Encounter (Signed)
Insurance denied Zolpiedem Tartrate. Called insurance co to start a fast appeal. The rep said the patient does not have pharmacy coverage under [employer] this plan year. She reccomended the patient be contacted to see if vendor changed. Called the patient and she states on her card it says [facility] is vendor. Resubmitted PA with [facility] form.

## 2015-04-18 NOTE — Telephone Encounter (Signed)
Patient states we sent it in through the wrong vendor. The number for the PA is [phone]. She needs a PA for Ambien 5 mg qhs. [facility].

## 2015-04-21 NOTE — Telephone Encounter (Signed)
ZOXWRU:[id number];XBJYNWGCaseId:[id number];Product Name:ST: High Risk Medications (Eszopiclone, Zaleplon, Zolpidem, Zolpidem ER) - [employer] MEDICARE PrP;Status:Denied;Appeal Information: Attention:MEDICARE CLINICAL APPEALS [facility] [phone] [id number],[address] [phone] Fax:[phone] WebAddress:[email or web address];  An appeal was submitted with the information that the patient has been on this medication for over five years and insomnia is stable. Appeal may still be denied since the patient has not tried preferred drug Rozeram or Trazodone.( this was confirmed when I called the patient on Friday)

## 2015-04-24 NOTE — Telephone Encounter (Signed)
Appeal was denied by insurance. Denial letter was placed in Metheney's box for review

## 2015-04-28 ENCOUNTER — Telehealth: Payer: Self-pay | Admitting: Family Medicine

## 2015-04-28 NOTE — Telephone Encounter (Signed)
Call pt: Christine [name] was denied. They will cover rozerem or trazodone.  Has she tried Rozerem before. Binds to the melatonin receptor in the braine.

## 2015-04-30 MED ORDER — RAMELTEON 8 MG PO TABS
8.0000 mg | ORAL_TABLET | Freq: Every day | ORAL | Status: DC
Start: 2015-04-30 — End: 2015-11-14

## 2015-04-30 NOTE — Telephone Encounter (Signed)
Pt called and stated that she got the denial letter from her insurance co and is willing to try the Rozerem. Informed her that this has already been sent. Advised that it would take about 1 wk for it to take effect.Loralee PacasBarkley, Zaliah Wissner [place]

## 2015-04-30 NOTE — Telephone Encounter (Signed)
New rx sent. It can take a week to start working well

## 2015-04-30 NOTE — Telephone Encounter (Signed)
The patient has not tried rozerem

## 2015-04-30 NOTE — Telephone Encounter (Signed)
Pt.notified

## 2015-05-14 ENCOUNTER — Telehealth: Payer: Self-pay | Admitting: *Deleted

## 2015-05-14 NOTE — Telephone Encounter (Signed)
[facility] Called the patient to f/u to see how she was doing on rozerem. The patient did not pick up the medication because it was going to cost her $400. The patient has a high deductable. The patient wanted to know what she could take over the counter. I did tell her melatonin was ususally safe but I would check with Dr. Linford ArnoldMetheney to confirm this and see if she has any other suggestions. Pt is to wait to hear back from me with Dr. Advice before taking any otc supplements.please advise

## 2015-05-14 NOTE — Telephone Encounter (Signed)
Yes, melatonin would be  A safe options. She may have a deductible as well which may have resulted in a high initial cost.

## 2015-05-15 NOTE — Telephone Encounter (Signed)
Pt.notified

## 2015-07-05 ENCOUNTER — Other Ambulatory Visit: Payer: Self-pay | Admitting: Family Medicine

## 2015-10-14 ENCOUNTER — Other Ambulatory Visit: Payer: Self-pay | Admitting: Family Medicine

## 2015-11-14 ENCOUNTER — Encounter: Payer: Self-pay | Admitting: Family Medicine

## 2015-11-14 ENCOUNTER — Ambulatory Visit (INDEPENDENT_AMBULATORY_CARE_PROVIDER_SITE_OTHER): Payer: Medicare Other | Admitting: Family Medicine

## 2015-11-14 VITALS — BP 129/73 | HR 85 | Wt 115.0 lb

## 2015-11-14 DIAGNOSIS — F331 Major depressive disorder, recurrent, moderate: Secondary | ICD-10-CM

## 2015-11-14 DIAGNOSIS — Z23 Encounter for immunization: Secondary | ICD-10-CM

## 2015-11-14 DIAGNOSIS — G2581 Restless legs syndrome: Secondary | ICD-10-CM | POA: Diagnosis not present

## 2015-11-14 DIAGNOSIS — G4700 Insomnia, unspecified: Secondary | ICD-10-CM

## 2015-11-14 MED ORDER — PAROXETINE HCL 20 MG PO TABS: ORAL_TABLET | ORAL | 1 refills | Status: DC

## 2015-11-14 MED ORDER — AMBULATORY NON FORMULARY MEDICATION: 0 refills | Status: DC

## 2015-11-14 MED ORDER — RAMELTEON 8 MG PO TABS: 8.0000 mg | ORAL_TABLET | Freq: Every day | ORAL | 3 refills | Status: DC

## 2015-11-14 NOTE — Progress Notes (Signed)
Subjective:    CC: Depression, weight loss  HPI: She has been feeling more depressed and having more headaches. She is just feeling a little overwhelmed recently. 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 He is currently able to do his ADLs but is struggling with executive function. He no longer drives. She said she's just been sad and tearful because she feels like she sort of Artie lost her partner in life. She's also had to take on a lot more responsibility around the home and financially. She has read the book the 36 hour day. She is also dealing with a sister who also has some early dementia. She's really lost her appetite and has not been eating much.  She also thinks she could have restless leg. She is c/o pain sensation in both of her calves. She tries to  move them around and most nights it does bother her and make more difficult for her to fall asleep.  Insomnia-since Medicare will not pay for the Ambien she says she's willing to try the Rozerem. She says at the point where she's not sleeping at all.  She wants flu vaccine and Tdap today.   Past medical history, Surgical history, Family history not pertinant except as noted below, Social history, Allergies, and medications have been entered into the medical record, reviewed, and corrections made.   Review of Systems: No fevers, chills, night sweats, weight loss, chest pain, or shortness of breath.   Objective:    General: Well Developed, well nourished, and in no acute distress.  Neuro: Alert and oriented x3, extra-ocular muscles intact, sensation grossly intact.  HEENT: Normocephalic, atraumatic  Skin: Warm and dry, no rashes. Cardiac: Regular rate and rhythm, no murmurs rubs or gallops, no lower extremity edema.  Respiratory: Clear to auscultation bilaterally. Not using accessory muscles, speaking in full sentences.   Impression and Recommendations:    Acute depression-discussed  options. Offered to refer her for counseling/therapy. She opted to just start with medication at least initially. She would like something that would actually stimulate her appetite. We'll start with proximal a teen. Follow-up in 3-4 weeks.  Possible restless leg syndrome-she did have a ferritin checked in the last year which was normal. Can consider treatment with medication but on hold off until I see her back in 3 weeks. Next  Insomnia-we'll try Rozerem.

## 2015-12-10 ENCOUNTER — Ambulatory Visit: Payer: Medicare Other | Admitting: Family Medicine

## 2015-12-16 ENCOUNTER — Encounter: Payer: Self-pay | Admitting: Family Medicine

## 2015-12-16 ENCOUNTER — Ambulatory Visit (INDEPENDENT_AMBULATORY_CARE_PROVIDER_SITE_OTHER): Payer: Medicare Other | Admitting: Family Medicine

## 2015-12-16 VITALS — BP 117/45 | HR 52 | Ht 60.0 in | Wt 114.0 lb

## 2015-12-16 DIAGNOSIS — F5101 Primary insomnia: Secondary | ICD-10-CM

## 2015-12-16 DIAGNOSIS — F331 Major depressive disorder, recurrent, moderate: Secondary | ICD-10-CM | POA: Diagnosis not present

## 2015-12-16 DIAGNOSIS — G2581 Restless legs syndrome: Secondary | ICD-10-CM | POA: Diagnosis not present

## 2015-12-16 MED ORDER — SERTRALINE HCL 50 MG PO TABS: 25.0000 mg | ORAL_TABLET | Freq: Every day | ORAL | 1 refills | Status: DC

## 2015-12-16 MED ORDER — ZOLPIDEM TARTRATE 5 MG PO TABS: 5.0000 mg | ORAL_TABLET | Freq: Every day | ORAL | 3 refills | Status: DC

## 2015-12-16 MED ORDER — ROPINIROLE HCL 0.25 MG PO TABS: 0.2500 mg | ORAL_TABLET | Freq: Every day | ORAL | 3 refills | Status: DC

## 2015-12-16 NOTE — Progress Notes (Addendum)
Subjective:    CC: Mood  HPI:  Follow-up acute major depressive disorder-she is dealing with becoming the primary caretaker for her husband who is developing advanced Alzheimer's and a sister who was also recently diagnosed with some early dementia. She started on paroxetine 4 weeks ago. She says she stopped it after 1.5 weeks.  Says it made her nauseated and caused weight loss.  She actually says she took Zoloft years ago and it would be willing to retry that. She really doesn't have time to commit to therapy or counseling at this point in time.  Follow up insomnia-Medicare will no longer pay for her Ambien so we decided to try Rozerem last saw her about 4 weeks ago. Says it was too costly so didn't purchase it.  Filled her old Ambien and says it was covered this time.   She also complains of symptoms at night where she feels like she constantly has to move her legs and feet. She said she'll move them around. Sometimes it so severe that it's difficult for her to sleep. She denies any numbness or tingling or injury. It's been going on for months if not longer. It's getting to the point where it's more bothersome than usual. No muscle cramping or change in activity.  Past medical history, Surgical history, Family history not pertinant except as noted below, Social history, Allergies, and medications have been entered into the medical record, reviewed, and corrections made.   Review of Systems: No fevers, chills, night sweats, weight loss, chest pain, or shortness of breath.   Objective:    General: Well Developed, well nourished, and in no acute distress.  Neuro: Alert and oriented x3, extra-ocular muscles intact, sensation grossly intact.  HEENT: Normocephalic, atraumatic  Skin: Warm and dry, no rashes. Cardiac: Regular rate and rhythm, no murmurs rubs or gallops, no lower extremity edema.  Respiratory: Clear to auscultation bilaterally. Not using accessory muscles, speaking in full  sentences.   Impression and Recommendations:    MDD -  PHQ 9 score of 11. Still complaining of low interest in doing things and feeling down or depressed several days of the week. Low energy and poor sleep quality daily. Will change to zoloft.  Discussed potential side effects. Follow-up in 6 weeks.  Insomnia - she will have to call her Medicare and find out what is covered.  For now the discomfort her last prescription of Ambien so we'll send a refill. If they don't cover it and she is willing to try trazodone since the Rozerem was too expensive.  RLS - discussed diagnosis. Ms. consistent with restless leg syndrome. We'll try treatment with ropinirole 0.25 mg at bedtime and can adjust dose of as needed. Iron stores checked in February of this year were normal. So not likely to be iron deficiency anemia.

## 2015-12-16 NOTE — Patient Instructions (Addendum)
Restless Legs Syndrome Restless legs syndrome is a condition that causes uncomfortable feelings or sensations in the legs, especially while sitting or lying down. The sensations usually cause an overwhelming urge to move the legs. The arms can also sometimes be affected. The condition can range from mild to severe. The symptoms often interfere with a person's ability to sleep. CAUSES The cause of this condition is not known. RISK FACTORS This condition is more likely to develop in:  People who are older than age 50.  Pregnant women. In general, restless legs syndrome is more common in women than in men.  People who have a family history of the condition.  People who have certain medical conditions, such as iron deficiency, kidney disease, Parkinson disease, or nerve damage.  People who take certain medicines, such as medicines for high blood pressure, nausea, colds, allergies, depression, and some heart conditions. SYMPTOMS The main symptom of this condition is uncomfortable sensations in the legs. These sensations may be:  Described as pulling, tingling, prickling, throbbing, crawling, or burning.  Worse while you are sitting or lying down.  Worse during periods of rest or inactivity.  Worse at night, often interfering with your sleep.  Accompanied by a very strong urge to move your legs.  Temporarily relieved by movement of your legs. The sensations usually affect both sides of the body. The arms can also be affected, but this is rare. People who have this condition often have tiredness during the day because of their lack of sleep at night. DIAGNOSIS This condition may be diagnosed based on your description of the symptoms. You may also have tests, including blood tests, to check for other conditions that may lead to your symptoms. In some cases, you may be asked to spend some time in a sleep lab so your sleeping can be monitored. TREATMENT Treatment for this condition is  focused on managing the symptoms. Treatment may include:  Self-help and lifestyle changes.  Medicines. HOME CARE INSTRUCTIONS  Take medicines only as directed by your health care provider.  Try these methods to get temporary relief from the uncomfortable sensations:  Massage your legs.  Walk or stretch.  Take a cold or hot bath.  Practice good sleep habits. For example, go to bed and get up at the same time every day.  Exercise regularly.  Practice ways of relaxing, such as yoga or meditation.  Avoid caffeine and alcohol.  Do not use any tobacco products, including cigarettes, chewing tobacco, or electronic cigarettes. If you need help quitting, ask your health care provider.  Keep all follow-up visits as directed by your health care provider. This is important. SEEK MEDICAL CARE IF: Your symptoms do not improve with treatment, or they get worse.   This information is not intended to replace advice given to you by your health care provider. Make sure you discuss any questions you have with your health care provider.   Document Released: 01/22/2002 Document Revised: 06/18/2014 Document Reviewed: 01/28/2014 Elsevier Interactive Patient Education 2016 [employer]  

## 2016-03-09 ENCOUNTER — Other Ambulatory Visit: Payer: Self-pay | Admitting: Family Medicine

## 2016-03-19 ENCOUNTER — Other Ambulatory Visit: Payer: Self-pay | Admitting: Family Medicine

## 2016-04-15 ENCOUNTER — Other Ambulatory Visit: Payer: Self-pay | Admitting: Family Medicine

## 2016-04-15 DIAGNOSIS — M810 Age-related osteoporosis without current pathological fracture: Secondary | ICD-10-CM

## 2016-04-21 ENCOUNTER — Other Ambulatory Visit: Payer: Self-pay | Admitting: Family Medicine

## 2016-04-21 ENCOUNTER — Ambulatory Visit (INDEPENDENT_AMBULATORY_CARE_PROVIDER_SITE_OTHER): Payer: Medicare Other

## 2016-04-21 ENCOUNTER — Ambulatory Visit (INDEPENDENT_AMBULATORY_CARE_PROVIDER_SITE_OTHER): Payer: Medicare Other | Admitting: Family Medicine

## 2016-04-21 VITALS — BP 142/63 | HR 53 | Wt 111.0 lb

## 2016-04-21 DIAGNOSIS — Z1231 Encounter for screening mammogram for malignant neoplasm of breast: Secondary | ICD-10-CM | POA: Diagnosis not present

## 2016-04-21 DIAGNOSIS — E039 Hypothyroidism, unspecified: Secondary | ICD-10-CM

## 2016-04-21 DIAGNOSIS — E7800 Pure hypercholesterolemia, unspecified: Secondary | ICD-10-CM | POA: Diagnosis not present

## 2016-04-21 DIAGNOSIS — F331 Major depressive disorder, recurrent, moderate: Secondary | ICD-10-CM | POA: Diagnosis not present

## 2016-04-21 DIAGNOSIS — G2581 Restless legs syndrome: Secondary | ICD-10-CM

## 2016-04-21 DIAGNOSIS — E559 Vitamin D deficiency, unspecified: Secondary | ICD-10-CM

## 2016-04-21 DIAGNOSIS — F5101 Primary insomnia: Secondary | ICD-10-CM

## 2016-04-21 LAB — CBC
HCT: 39.6 % (ref 35.0–45.0)
Hemoglobin: 12.4 g/dL (ref 11.7–15.5)
MCH: 28.6 pg (ref 27.0–33.0)
MCHC: 31.3 g/dL — ABNORMAL LOW (ref 32.0–36.0)
MCV: 91.2 fL (ref 80.0–100.0)
MPV: 10.3 fL (ref 7.5–12.5)
PLATELETS: 360 10*3/uL (ref 140–400)
RBC: 4.34 MIL/uL (ref 3.80–5.10)
RDW: 13.5 % (ref 11.0–15.0)
WBC: 5.8 10*3/uL (ref 3.8–10.8)

## 2016-04-21 LAB — LIPID PANEL W/REFLEX DIRECT LDL
CHOL/HDL RATIO: 2.7 ratio (ref <5.0)
CHOLESTEROL: 145 mg/dL (ref <200)
HDL: 54 mg/dL (ref >50)
LDL-Cholesterol: 75 mg/dL
Non-HDL Cholesterol (Calc): 91 mg/dL (ref <130)
Triglycerides: 79 mg/dL (ref <150)

## 2016-04-21 LAB — COMPLETE METABOLIC PANEL WITH GFR
ALBUMIN: 4.5 g/dL (ref 3.6–5.1)
ALK PHOS: 68 U/L (ref 33–130)
ALT: 14 U/L (ref 6–29)
AST: 16 U/L (ref 10–35)
BUN: 9 mg/dL (ref 7–25)
CHLORIDE: 105 mmol/L (ref 98–110)
CO2: 28 mmol/L (ref 20–31)
Calcium: 10.1 mg/dL (ref 8.6–10.4)
Creat: 0.79 mg/dL (ref 0.60–0.93)
GFR, EST NON AFRICAN AMERICAN: 74 mL/min (ref >=60)
GFR, Est African American: 86 mL/min (ref >=60)
GLUCOSE: 89 mg/dL (ref 65–99)
POTASSIUM: 4.7 mmol/L (ref 3.5–5.3)
SODIUM: 139 mmol/L (ref 135–146)
Total Bilirubin: 0.6 mg/dL (ref 0.2–1.2)
Total Protein: 7.3 g/dL (ref 6.1–8.1)

## 2016-04-21 LAB — TSH: TSH: 0.25 m[IU]/L — AB

## 2016-04-21 MED ORDER — ROPINIROLE HCL 0.25 MG PO TABS: 0.2500 mg | ORAL_TABLET | Freq: Every day | ORAL | 1 refills | Status: DC

## 2016-04-21 NOTE — Progress Notes (Signed)
Subjective:    CC: MDD  HPI:  Follow-up major depressive disorder-when I last saw her in October she was not doing well still complain low energy and poor sleep quality so we decided to change to Zoloft. She was encouraged to follow up in 6 weeks, but she is following up today.  Insomnia- she was able to get the Ambien covered by insurance.    Restless leg syndrome-follow-up on recent diagnosis. we started with ropinirole 0.25 mg at bedtime. Negative for iron deficiency anemia. She says it has been working well.    Past medical history, Surgical history, Family history not pertinant except as noted below, Social history, Allergies, and medications have been entered into the medical record, reviewed, and corrections made.   Review of Systems: No fevers, chills, night sweats, weight loss, chest pain, or shortness of breath.   Objective:    General: Well Developed, well nourished, and in no acute distress.  Neuro: Alert and oriented x3, extra-ocular muscles intact, sensation grossly intact.  HEENT: Normocephalic, atraumatic  Skin: Warm and dry, no rashes. Cardiac: Regular rate and rhythm, no murmurs rubs or gallops, no lower extremity edema.  Respiratory: Clear to auscultation bilaterally. Not using accessory muscles, speaking in full sentences.   Impression and Recommendations:    MDD -  PHQ 9 score of9, down from 11. She is happy with her current regimen of sertraline and does not want to increase her dose at this time. Continue to monitor. Like to see her back in about 6 weeks.  Insomnia, Chronic - continue with Ambien.  RLS - well on low-dose of her quit. We'll continue current regimen and send refills today.

## 2016-04-22 ENCOUNTER — Ambulatory Visit: Payer: Medicare Other | Admitting: Family Medicine

## 2016-04-22 ENCOUNTER — Other Ambulatory Visit: Payer: Self-pay | Admitting: Family Medicine

## 2016-04-22 LAB — VITAMIN D 25 HYDROXY (VIT D DEFICIENCY, FRACTURES): VIT D 25 HYDROXY: 39 ng/mL (ref 30–100)

## 2016-04-22 MED ORDER — SYNTHROID 50 MCG PO TABS: 50.0000 ug | ORAL_TABLET | Freq: Every day | ORAL | 3 refills | Status: DC

## 2016-04-22 NOTE — Addendum Note (Signed)
Addended by: Deno EtienneBARKLEY, Miriya Cloer L on: 04/22/2016 05:37 PM   Modules accepted: Orders

## 2016-06-10 ENCOUNTER — Other Ambulatory Visit: Payer: Self-pay | Admitting: *Deleted

## 2016-06-10 LAB — TSH: TSH: 0.36 m[IU]/L — AB

## 2016-06-10 MED ORDER — SERTRALINE HCL 50 MG PO TABS: 50.0000 mg | ORAL_TABLET | Freq: Every day | ORAL | 3 refills | Status: DC

## 2016-06-14 ENCOUNTER — Other Ambulatory Visit: Payer: Self-pay | Admitting: Family Medicine

## 2016-07-05 ENCOUNTER — Telehealth: Payer: Self-pay | Admitting: Family Medicine

## 2016-07-05 DIAGNOSIS — E039 Hypothyroidism, unspecified: Secondary | ICD-10-CM

## 2016-07-05 NOTE — Telephone Encounter (Signed)
Pt called. She has scheduled a Wellness Exam on 6/7 and wants to pick up a Lab Order on 5/29.  Thank you.

## 2016-07-06 NOTE — Telephone Encounter (Signed)
Spoke w/pt and informed her that she only needs tsh. Will fax to lab.Loralee PacasBarkley, Keishawn Rajewski [place]

## 2016-07-21 LAB — TSH: TSH: 0.09 m[IU]/L — AB

## 2016-07-22 ENCOUNTER — Ambulatory Visit (INDEPENDENT_AMBULATORY_CARE_PROVIDER_SITE_OTHER): Payer: Medicare Other | Admitting: Family Medicine

## 2016-07-22 ENCOUNTER — Encounter: Payer: Self-pay | Admitting: Family Medicine

## 2016-07-22 VITALS — BP 125/61 | HR 64 | Ht 60.0 in | Wt 111.0 lb

## 2016-07-22 DIAGNOSIS — Z0000 Encounter for general adult medical examination without abnormal findings: Secondary | ICD-10-CM | POA: Diagnosis not present

## 2016-07-22 DIAGNOSIS — R143 Flatulence: Secondary | ICD-10-CM | POA: Diagnosis not present

## 2016-07-22 NOTE — Patient Instructions (Addendum)
Can try a Probiotic like Align for your gut and see if helps.    Health Maintenance, Female Adopting a healthy lifestyle and getting preventive care can go a long way to promote health and wellness. Talk with your health care provider about what schedule of regular examinations is right for you. This is a good chance for you to check in with your provider about disease prevention and staying healthy. In between checkups, there are plenty of things you can do on your own. Experts have done a lot of research about which lifestyle changes and preventive measures are most likely to keep you healthy. Ask your health care provider for more information. Weight and diet Eat a healthy diet  Be sure to include plenty of vegetables, fruits, low-fat dairy products, and lean protein.  Do not eat a lot of foods high in solid fats, added sugars, or salt.  Get regular exercise. This is one of the most important things you can do for your health. ? Most adults should exercise for at least 150 minutes each week. The exercise should increase your heart rate and make you sweat (moderate-intensity exercise). ? Most adults should also do strengthening exercises at least twice a week. This is in addition to the moderate-intensity exercise.  Maintain a healthy weight  Body mass index (BMI) is a measurement that can be used to identify possible weight problems. It estimates body fat based on height and weight. Your health care provider can help determine your BMI and help you achieve or maintain a healthy weight.  For females 86 years of age and older: ? A BMI below 18.5 is considered underweight. ? A BMI of 18.5 to 24.9 is normal. ? A BMI of 25 to 29.9 is considered overweight. ? A BMI of 30 and above is considered obese.  Watch levels of cholesterol and blood lipids  You should start having your blood tested for lipids and cholesterol at 74 years of age, then have this test every 5 years.  You may need to  have your cholesterol levels checked more often if: ? Your lipid or cholesterol levels are high. ? You are older than 74 years of age. ? You are at high risk for heart disease.  Cancer screening Lung Cancer  Lung cancer screening is recommended for adults 52-29 years old who are at high risk for lung cancer because of a history of smoking.  A yearly low-dose CT scan of the lungs is recommended for people who: ? Currently smoke. ? Have quit within the past 15 years. ? Have at least a 30-pack-year history of smoking. A pack year is smoking an average of one pack of cigarettes a day for 1 year.  Yearly screening should continue until it has been 15 years since you quit.  Yearly screening should stop if you develop a health problem that would prevent you from having lung cancer treatment.  Breast Cancer  Practice breast self-awareness. This means understanding how your breasts normally appear and feel.  It also means doing regular breast self-exams. Let your health care provider know about any changes, no matter how small.  If you are in your 20s or 30s, you should have a clinical breast exam (CBE) by a health care provider every 1-3 years as part of a regular health exam.  If you are 14 or older, have a CBE every year. Also consider having a breast X-ray (mammogram) every year.  If you have a family history of breast cancer,  talk to your health care provider about genetic screening.  If you are at high risk for breast cancer, talk to your health care provider about having an MRI and a mammogram every year.  Breast cancer gene (BRCA) assessment is recommended for women who have family members with BRCA-related cancers. BRCA-related cancers include: ? Breast. ? Ovarian. ? Tubal. ? Peritoneal cancers.  Results of the assessment will determine the need for genetic counseling and BRCA1 and BRCA2 testing.  Cervical Cancer Your health care provider may recommend that you be screened  regularly for cancer of the pelvic organs (ovaries, uterus, and vagina). This screening involves a pelvic examination, including checking for microscopic changes to the surface of your cervix (Pap test). You may be encouraged to have this screening done every 3 years, beginning at age 27.  For women ages 48-65, health care providers may recommend pelvic exams and Pap testing every 3 years, or they may recommend the Pap and pelvic exam, combined with testing for human papilloma virus (HPV), every 5 years. Some types of HPV increase your risk of cervical cancer. Testing for HPV may also be done on women of any age with unclear Pap test results.  Other health care providers may not recommend any screening for nonpregnant women who are considered low risk for pelvic cancer and who do not have symptoms. Ask your health care provider if a screening pelvic exam is right for you.  If you have had past treatment for cervical cancer or a condition that could lead to cancer, you need Pap tests and screening for cancer for at least 20 years after your treatment. If Pap tests have been discontinued, your risk factors (such as having a new sexual partner) need to be reassessed to determine if screening should resume. Some women have medical problems that increase the chance of getting cervical cancer. In these cases, your health care provider may recommend more frequent screening and Pap tests.  Colorectal Cancer  This type of cancer can be detected and often prevented.  Routine colorectal cancer screening usually begins at 74 years of age and continues through 74 years of age.  Your health care provider may recommend screening at an earlier age if you have risk factors for colon cancer.  Your health care provider may also recommend using home test kits to check for hidden blood in the stool.  A small camera at the end of a tube can be used to examine your colon directly (sigmoidoscopy or colonoscopy). This is  done to check for the earliest forms of colorectal cancer.  Routine screening usually begins at age 74.  Direct examination of the colon should be repeated every 5-10 years through 74 years of age. However, you may need to be screened more often if early forms of precancerous polyps or small growths are found.  Skin Cancer  Check your skin from head to toe regularly.  Tell your health care provider about any new moles or changes in moles, especially if there is a change in a mole's shape or color.  Also tell your health care provider if you have a mole that is larger than the size of a pencil eraser.  Always use sunscreen. Apply sunscreen liberally and repeatedly throughout the day.  Protect yourself by wearing long sleeves, pants, a wide-brimmed hat, and sunglasses whenever you are outside.  Heart disease, diabetes, and high blood pressure  High blood pressure causes heart disease and increases the risk of stroke. High blood pressure is  more likely to develop in: ? People who have blood pressure in the high end of the normal range (130-139/85-89 mm Hg). ? People who are overweight or obese. ? People who are African American.  If you are 90-6 years of age, have your blood pressure checked every 3-5 years. If you are 13 years of age or older, have your blood pressure checked every year. You should have your blood pressure measured twice-once when you are at a hospital or clinic, and once when you are not at a hospital or clinic. Record the average of the two measurements. To check your blood pressure when you are not at a hospital or clinic, you can use: ? An automated blood pressure machine at a pharmacy. ? A home blood pressure monitor.  If you are between 68 years and 32 years old, ask your health care provider if you should take aspirin to prevent strokes.  Have regular diabetes screenings. This involves taking a blood sample to check your fasting blood sugar level. ? If you are  at a normal weight and have a low risk for diabetes, have this test once every three years after 74 years of age. ? If you are overweight and have a high risk for diabetes, consider being tested at a younger age or more often. Preventing infection Hepatitis B  If you have a higher risk for hepatitis B, you should be screened for this virus. You are considered at high risk for hepatitis B if: ? You were born in a country where hepatitis B is common. Ask your health care provider which countries are considered high risk. ? Your parents were born in a high-risk country, and you have not been immunized against hepatitis B (hepatitis B vaccine). ? You have HIV or AIDS. ? You use needles to inject street drugs. ? You live with someone who has hepatitis B. ? You have had sex with someone who has hepatitis B. ? You get hemodialysis treatment. ? You take certain medicines for conditions, including cancer, organ transplantation, and autoimmune conditions.  Hepatitis C  Blood testing is recommended for: ? Everyone born from [birth year] through 1963. ? Anyone with known risk factors for hepatitis C.  Sexually transmitted infections (STIs)  You should be screened for sexually transmitted infections (STIs) including gonorrhea and chlamydia if: ? You are sexually active and are younger than 74 years of age. ? You are older than 74 years of age and your health care provider tells you that you are at risk for this type of infection. ? Your sexual activity has changed since you were last screened and you are at an increased risk for chlamydia or gonorrhea. Ask your health care provider if you are at risk.  If you do not have HIV, but are at risk, it may be recommended that you take a prescription medicine daily to prevent HIV infection. This is called pre-exposure prophylaxis (PrEP). You are considered at risk if: ? You are sexually active and do not regularly use condoms or know the HIV status of your  partner(s). ? You take drugs by injection. ? You are sexually active with a partner who has HIV.  Talk with your health care provider about whether you are at high risk of being infected with HIV. If you choose to begin PrEP, you should first be tested for HIV. You should then be tested every 3 months for as long as you are taking PrEP. Pregnancy  If you are premenopausal and you  may become pregnant, ask your health care provider about preconception counseling.  If you may become pregnant, take 400 to 800 micrograms (mcg) of folic acid every day.  If you want to prevent pregnancy, talk to your health care provider about birth control (contraception). Osteoporosis and menopause  Osteoporosis is a disease in which the bones lose minerals and strength with aging. This can result in serious bone fractures. Your risk for osteoporosis can be identified using a bone density scan.  If you are 59 years of age or older, or if you are at risk for osteoporosis and fractures, ask your health care provider if you should be screened.  Ask your health care provider whether you should take a calcium or vitamin D supplement to lower your risk for osteoporosis.  Menopause may have certain physical symptoms and risks.  Hormone replacement therapy may reduce some of these symptoms and risks. Talk to your health care provider about whether hormone replacement therapy is right for you. Follow these instructions at home:  Schedule regular health, dental, and eye exams.  Stay current with your immunizations.  Do not use any tobacco products including cigarettes, chewing tobacco, or electronic cigarettes.  If you are pregnant, do not drink alcohol.  If you are breastfeeding, limit how much and how often you drink alcohol.  Limit alcohol intake to no more than 1 drink per day for nonpregnant women. One drink equals 12 ounces of beer, 5 ounces of wine, or 1 ounces of hard liquor.  Do not use street  drugs.  Do not share needles.  Ask your health care provider for help if you need support or information about quitting drugs.  Tell your health care provider if you often feel depressed.  Tell your health care provider if you have ever been abused or do not feel safe at home. This information is not intended to replace advice given to you by your health care provider. Make sure you discuss any questions you have with your health care provider. Document Released: 08/17/2010 Document Revised: 07/10/2015 Document Reviewed: 11/05/2014 Elsevier Interactive Patient Education  [employer].

## 2016-07-22 NOTE — Progress Notes (Addendum)
Subjective:   Christine [name] is a 76 y.o. female who presents for Medicare Annual (Subsequent) preventive examination.  Review of Systems:  Comprehensive ROS is negative. Though does complain of being more gassy as she gets older.  No abdominal pain.    She does have a bike at home and admits that she hasn't been as consistent with exercise on the bite she would like to be but plans to. She reports that her mammogram and bone density are up-to-date.       Objective:     Vitals: BP 125/61   Pulse 64   Ht 5' (1.524 m)   Wt 111 lb (50.3 kg)   BMI 21.68 kg/m   Body mass index is 21.68 kg/m.  Physical Exam  Constitutional: She is oriented to person, place, and time. She appears well-developed and well-nourished.  HENT:  Head: Normocephalic and atraumatic.  Cardiovascular: Normal rate, regular rhythm and normal heart sounds.   Pulmonary/Chest: Effort normal and breath sounds normal.  Neurological: She is alert and oriented to person, place, and time.  Skin: Skin is warm and dry.  Psychiatric: She has a normal mood and affect. Her behavior is normal.     Tobacco History  Smoking Status  . Never Smoker  Smokeless Tobacco  . Never Used     Counseling given: Not Answered   Past Medical History:  Diagnosis Date  . Depression   . Hyperlipidemia   . Hypertriglyceridemia   . Insomnia   . Osteopenia    Past Surgical History:  Procedure Laterality Date  . ABDOMINAL HYSTERECTOMY  1991  . COSMETIC SURGERY  1997   liposuction   Family History  Problem Relation Age of Onset  . Aneurysm Mother        abdominal  . Heart failure Mother   . Lung cancer Father        smoker   History  Sexual Activity  . Sexual activity: Not on file    Outpatient Encounter Prescriptions as of 07/22/2016  Medication Sig  . AMBULATORY NON FORMULARY MEDICATION Medication Name: Tdap IM x 1  . atorvastatin (LIPITOR) 10 MG tablet take 1 tablet by mouth once daily  . ibandronate (BONIVA)  150 MG tablet take 1 tablet by mouth every month IN THE MORNING WITH A FUL  . rOPINIRole (REQUIP) 0.25 MG tablet Take 1 tablet (0.25 mg total) by mouth at bedtime.  . sertraline (ZOLOFT) 50 MG tablet Take 1 tablet (50 mg total) by mouth daily.  Marland Kitchen. SYNTHROID 50 MCG tablet Take 1 tablet (50 mcg total) by mouth daily before breakfast.  . zolpidem (AMBIEN) 5 MG tablet take 1 tablet by mouth at bedtime if needed for sleep   No facility-administered encounter medications on file as of 07/22/2016.     Activities of Daily Living In your present state of health, do you have any difficulty performing the following activities: 07/23/2016  Hearing? N  Vision? N  Difficulty concentrating or making decisions? N  Walking or climbing stairs? N  Dressing or bathing? N  Doing errands, shopping? N  Some recent data might be hidden    Patient Care Team: Agapito GamesMetheney, Catherine D, MD as PCP - General    Assessment:    Medicare WEllness Exam    Exercise Activities and Dietary recommendations    Goals    None     Fall Risk Fall Risk  04/22/2016 04/01/2015 03/27/2014 01/23/2013  Falls in the past year? No Yes No No  Number falls in past yr: - 1 - -  Injury with Fall? - No - -  Risk for fall due to : - Other (Comment) - -  Follow up - Falls prevention discussed - -   Depression Screen PHQ 2/9 Scores 07/22/2016 04/22/2016 11/14/2015 04/01/2015  PHQ - 2 Score 1 0 2 0  PHQ- 9 Score - 9 12 -     Cognitive Function     6CIT Screen 07/22/2016  What Year? 0 points  What month? 0 points  What time? 0 points  Count back from 20 0 points  Months in reverse 0 points  Repeat phrase 2 points  Total Score 2    Immunization History  Administered Date(s) Administered  . Influenza Split 11/19/2011  . Influenza Whole 11/05/2008  . Influenza, High Dose Seasonal PF 11/20/2014, 11/21/2014  . Influenza,inj,Quad PF,36+ Mos 01/23/2013, 10/15/2013, 11/14/2015  . Pneumococcal Conjugate-13 09/25/2014  . Pneumococcal  Polysaccharide-23 02/15/2009  . Td 01/04/2006  . Tdap 12/17/2015   Screening Tests Health Maintenance  Topic Date Due  . INFLUENZA VACCINE  09/15/2016  . MAMMOGRAM  04/22/2018  . COLONOSCOPY  10/28/2024  . TETANUS/TDAP  12/16/2025  . DEXA SCAN  Completed  . PNA vac Low Risk Adult  Completed      Plan:   Medicare WEllness Exam  I have personally reviewed and noted the following in the patient's chart:   . Medical and social history . Use of alcohol, tobacco or illicit drugs  . Current medications and supplements . Functional ability and status . Nutritional status . Physical activity . Advanced directives . List of other physicians . Hospitalizations, surgeries, and ER visits in previous 12 months . Vitals . Screenings to include cognitive, depression, and falls . Referrals and appointments . [name] - discussed trial of probiotic, avoiding milk products. Consider food sensitivity testing if persists.    In addition, I have reviewed and discussed with patient certain preventive protocols, quality metrics, and best practice recommendations. A written personalized care plan for preventive services as well as general preventive health recommendations were provided to patient.     METHENEY,CATHERINE, MD  07/23/2016

## 2016-12-17 ENCOUNTER — Ambulatory Visit (INDEPENDENT_AMBULATORY_CARE_PROVIDER_SITE_OTHER): Payer: Medicare Other | Admitting: Family Medicine

## 2016-12-17 ENCOUNTER — Encounter: Payer: Self-pay | Admitting: Family Medicine

## 2016-12-17 VITALS — BP 130/60 | HR 60 | Ht 60.0 in | Wt 106.0 lb

## 2016-12-17 DIAGNOSIS — M546 Pain in thoracic spine: Secondary | ICD-10-CM | POA: Diagnosis not present

## 2016-12-17 DIAGNOSIS — Z23 Encounter for immunization: Secondary | ICD-10-CM | POA: Diagnosis not present

## 2016-12-17 DIAGNOSIS — R634 Abnormal weight loss: Secondary | ICD-10-CM

## 2016-12-17 MED ORDER — PAROXETINE HCL 20 MG PO TABS: 20.0000 mg | ORAL_TABLET | Freq: Every day | ORAL | 1 refills | Status: DC

## 2016-12-17 NOTE — Progress Notes (Addendum)
Subjective:    Patient ID: Christine [name], female    DOB: 02/13/1941, 76 y.o.   MRN: [id number]  HPI 76 year old female comes in today with multiple complaints.  She complains about mid back pain-  she points to the area just below her left shoulder blade, just lateral to the  spine. She feels like it is a dull ache will occ shooting pain. She thinks it's from having to lift her husband he is no longer able to turn on his own after having a stroke.  She also complains about abnormal weight loss- she has lost about 5 lbs since June of this year.    She is also concerned about her mood. Her husband recently had a stroke and so she's been under a lot more stress recently.  She is not sleeping well.    She would like to get her flu vaccine today.   Review of Systems  BP 130/60   Pulse 60   Ht 5' (1.524 m)   Wt 106 lb (48.1 kg)   SpO2 100%   BMI 20.70 kg/m     Allergies  Allergen Reactions  . Paroxetine Nausea Only and Other (See Comments)    Headache,weight loss, loss of appetite   . Paroxetine Hcl Nausea Only    Past Medical History:  Diagnosis Date  . Depression   . Hyperlipidemia   . Hypertriglyceridemia   . Insomnia   . Osteopenia     Past Surgical History:  Procedure Laterality Date  . ABDOMINAL HYSTERECTOMY  1991  . COSMETIC SURGERY  1997   liposuction    Social History   Socioeconomic History  . Marital status: Married    Spouse name: Molly Maduro  . Number of children: 2  . Years of education: 46  . Highest education level: Not on file  Social Needs  . Financial resource strain: Not on file  . Food insecurity - worry: Not on file  . Food insecurity - inability: Not on file  . Transportation needs - medical: Not on file  . Transportation needs - non-medical: Not on file  Occupational History  . Occupation: Still working    Employer: [employer]  Tobacco Use  . Smoking status: Never Smoker  . Smokeless tobacco: Never Used  Substance and  Sexual Activity  . Alcohol use: No  . Drug use: No  . Sexual activity: Not on file  Other Topics Concern  . Not on file  Social History Narrative   1 caffeine drink per day.  No regular exercise.    Family History  Problem Relation Age of Onset  . Aneurysm Mother        abdominal  . Heart failure Mother   . Lung cancer Father        smoker    Outpatient Encounter Medications as of 12/17/2016  Medication Sig  . AMBULATORY NON FORMULARY MEDICATION Medication Name: Tdap IM x 1  . atorvastatin (LIPITOR) 10 MG tablet take 1 tablet by mouth once daily  . ibandronate (BONIVA) 150 MG tablet take 1 tablet by mouth every month IN THE MORNING WITH A FUL  . rOPINIRole (REQUIP) 0.25 MG tablet Take 1 tablet (0.25 mg total) by mouth at bedtime.  . sertraline (ZOLOFT) 50 MG tablet Take 1 tablet (50 mg total) by mouth daily.  Marland Kitchen SYNTHROID 50 MCG tablet Take 1 tablet (50 mcg total) by mouth daily before breakfast.  . zolpidem (AMBIEN) 5 MG tablet take 1 tablet  by mouth at bedtime if needed for sleep  . PARoxetine (PAXIL) 20 MG tablet Take 1 tablet (20 mg total) by mouth daily. 1/2 tab daily for 2 weeks, then [facility] to whole tab daily.   No facility-administered encounter medications on file as of 12/17/2016.          Objective:   Physical Exam  Constitutional: She is oriented to person, place, and time. She appears well-developed and well-nourished.  HENT:  Head: Normocephalic and atraumatic.  Cardiovascular: Normal rate, regular rhythm and normal heart sounds.  Pulmonary/Chest: Effort normal and breath sounds normal.  Musculoskeletal:  None tender over the thoracic spine and tender over the right shoulder blade. Shoulders with NROM. strenght is 5/5 in the UE.   Neurological: She is alert and oriented to person, place, and time.  Skin: Skin is warm and dry.  Psychiatric: She has a normal mood and affect. Her behavior is normal.          Assessment & Plan:  MId back pain-most  consistant with MSK strain.  Work on stretches. H.O given. Ok to use NSAID if tolerate well.   Abnormal weight loss-work on eating more consistantly and hydrate well and keep an eye on weight. I think this is mood related.    Acute stress - taking care of her husband whose health is faililng.  We also discussed thereapy counseling and medication. She would like to try paxil again which could also help with her appetite. Start with half a tab and f.u in 3-4 weeks.  PHQ-9 score of 10 and GAD - 7 score of 20.   Flu vaccine updated today.

## 2016-12-17 NOTE — Patient Instructions (Addendum)
Recommend ibuprofen 400 mg 3 times a day for your back. If you have Aleve at home than you can take 1 Aleve twice a day instead of the ibuprofen. Recommend using a heating pad for 15-20 minutes a couple times a day and working on gentle stretches. Please see the handout. If you're not feeling better over the next week or 2 them please let me know. Cut her Zoloft in half and start the Paxil. He will be on both medications for 2 weeks. At the end of the 2 weeks then you can take a half a tab of the Zoloft every other day for 1 week and then stop it completely. As far as the Paxil just follow the instructions on the bottle.

## 2017-01-13 ENCOUNTER — Other Ambulatory Visit: Payer: Self-pay | Admitting: Family Medicine

## 2017-01-20 ENCOUNTER — Telehealth: Payer: Self-pay | Admitting: *Deleted

## 2017-01-20 NOTE — Telephone Encounter (Signed)
Pre Authorization sent to cover my meds. Approvedtoday  CaseId:[id number];Status:Approved;Review Type:Prior Auth;Coverage Start Date:12/21/2016;Coverage End Date:01/20/2018 Patient notified and pharmacy notified

## 2017-01-25 ENCOUNTER — Ambulatory Visit: Payer: Medicare Other | Admitting: Family Medicine

## 2017-03-23 ENCOUNTER — Other Ambulatory Visit: Payer: Self-pay | Admitting: Family Medicine

## 2017-04-14 ENCOUNTER — Other Ambulatory Visit: Payer: Self-pay | Admitting: Family Medicine

## 2017-04-26 ENCOUNTER — Encounter: Payer: Self-pay | Admitting: Family Medicine

## 2017-04-26 ENCOUNTER — Ambulatory Visit (INDEPENDENT_AMBULATORY_CARE_PROVIDER_SITE_OTHER): Payer: Medicare Other | Admitting: Family Medicine

## 2017-04-26 VITALS — BP 138/57 | HR 60 | Ht 60.0 in | Wt 110.0 lb

## 2017-04-26 DIAGNOSIS — M810 Age-related osteoporosis without current pathological fracture: Secondary | ICD-10-CM

## 2017-04-26 DIAGNOSIS — F4321 Adjustment disorder with depressed mood: Secondary | ICD-10-CM

## 2017-04-26 DIAGNOSIS — E7800 Pure hypercholesterolemia, unspecified: Secondary | ICD-10-CM

## 2017-04-26 DIAGNOSIS — E039 Hypothyroidism, unspecified: Secondary | ICD-10-CM | POA: Diagnosis not present

## 2017-04-26 MED ORDER — LEVOTHYROXINE SODIUM 50 MCG PO TABS: 50.0000 ug | ORAL_TABLET | Freq: Every day | ORAL | 0 refills | Status: DC

## 2017-04-26 MED ORDER — ATORVASTATIN CALCIUM 10 MG PO TABS: 10.0000 mg | ORAL_TABLET | Freq: Every day | ORAL | 3 refills | Status: DC

## 2017-04-26 MED ORDER — IBANDRONATE SODIUM 150 MG PO TABS: ORAL_TABLET | ORAL | 0 refills | Status: DC

## 2017-04-26 NOTE — Patient Instructions (Signed)
Increase her paxil to 1.5 tabs daily and see if helping her mood.

## 2017-04-26 NOTE — Progress Notes (Signed)
Subjective:    Patient ID: Christine [name], female    DOB: [birth year], 77 y.o.   MRN: [id number]  HPI 77 yo female is here today to follow-up on the recent loss of her husband.  He was quite ill and she took care of him for some time and he finally passed away in 2023/01/23.  She is just struggling with grieving.  She says at first she had difficulty even getting out of bed but then starting in February she started getting back to church and getting out a little bit more but she still has a lot of trouble just feeling motivated.  She is Artie on Paxil which she was on before her husband passed away.  She has been taking that regularly as well as her Ambien but says she really has not been consistent with her Boniva or her cholesterol medication.  Hyperlipidemia-she also wanted to let me know that she is stopped taking her cholesterol pill.  Follow-up hypothyroidism-she would like to have her brand thyroid changed to generic before cost savings.  Review of Systems   BP (!) 138/57   Pulse 60   Ht 5' (1.524 m)   Wt 110 lb (49.9 kg)   SpO2 100%   BMI 21.48 kg/m     Allergies  Allergen Reactions  . Paroxetine Nausea Only and Other (See Comments)    Headache,weight loss, loss of appetite   . Paroxetine Hcl Nausea Only    Past Medical History:  Diagnosis Date  . Depression   . Hyperlipidemia   . Hypertriglyceridemia   . Insomnia   . Osteopenia     Past Surgical History:  Procedure Laterality Date  . ABDOMINAL HYSTERECTOMY  1991  . COSMETIC SURGERY  1997   liposuction    Social History   Socioeconomic History  . Marital status: Widowed    Spouse name: Molly Maduro  . Number of children: 2  . Years of education: 18  . Highest education level: Not on file  Social Needs  . Financial resource strain: Not on file  . Food insecurity - worry: Not on file  . Food insecurity - inability: Not on file  . Transportation needs - medical: Not on file  . Transportation needs - non-medical:  Not on file  Occupational History  . Occupation: Still working    Employer: [employer]  Tobacco Use  . Smoking status: Never Smoker  . Smokeless tobacco: Never Used  Substance and Sexual Activity  . Alcohol use: No  . Drug use: No  . Sexual activity: Not on file  Other Topics Concern  . Not on file  Social History Narrative   1 caffeine drink per day.  No regular exercise.    Family History  Problem Relation Age of Onset  . Aneurysm Mother        abdominal  . Heart failure Mother   . Lung cancer Father        smoker    Outpatient Encounter Medications as of 04/26/2017  Medication Sig  . atorvastatin (LIPITOR) 10 MG tablet Take 1 tablet (10 mg total) by mouth daily.  Marland Kitchen ibandronate (BONIVA) 150 MG tablet take 1 tablet by mouth every month IN THE MORNING WITH A FUL  . PARoxetine (PAXIL) 20 MG tablet Take 1 tablet (20 mg total) by mouth daily.  Marland Kitchen rOPINIRole (REQUIP) 0.25 MG tablet Take 1 tablet (0.25 mg total) by mouth at bedtime.  Marland Kitchen zolpidem (AMBIEN) 5 MG tablet TAKE 1 TABLET  BY MOUTH AT BEDTIME IF NEEDED FOR SLEEP  . [DISCONTINUED] atorvastatin (LIPITOR) 10 MG tablet take 1 tablet by mouth once daily  . [DISCONTINUED] SYNTHROID 50 MCG tablet TAKE 1 TABLET BY MOUTH EVERY MORNING BEFORE BREAKFAST EVERY  . levothyroxine (SYNTHROID, LEVOTHROID) 50 MCG tablet Take 1 tablet (50 mcg total) by mouth daily before breakfast.  . [DISCONTINUED] AMBULATORY NON FORMULARY MEDICATION Medication Name: Tdap IM x 1  . [DISCONTINUED] sertraline (ZOLOFT) 50 MG tablet Take 1 tablet (50 mg total) by mouth daily.   No facility-administered encounter medications on file as of 04/26/2017.           Objective:   Physical Exam  Constitutional: She is oriented to person, place, and time. She appears well-developed and well-nourished.  HENT:  Head: Normocephalic and atraumatic.  Neck: Neck supple. No thyromegaly present.  Cardiovascular: Normal rate, regular rhythm and normal heart  sounds.  Pulmonary/Chest: Effort normal and breath sounds normal.  Neurological: She is alert and oriented to person, place, and time.  Skin: Skin is warm and dry.  Psychiatric: She has a normal mood and affect. Her behavior is normal.       Assessment & Plan:  Grieving -discussed options.  Recommended considering grieving counseling through hospice but [place] [facility] and she really does feel like she is getting better and she is going to take time.  We also discussed the option of increasing her fluoxetine to 30 mg daily by taking an extra half a tab daily.  She says she would be willing to try that for a couple weeks to see if it is helpful or not.  Hyperlipidemia-urged her to restart her medication.  Hypothyroid -switch to generic levothyroxine and recheck level in 6 weeks.  Osteoporosis-did encourage her to restart her Boniva.

## 2017-05-02 ENCOUNTER — Other Ambulatory Visit: Payer: Self-pay | Admitting: *Deleted

## 2017-05-02 DIAGNOSIS — M810 Age-related osteoporosis without current pathological fracture: Secondary | ICD-10-CM

## 2017-05-02 MED ORDER — IBANDRONATE SODIUM 150 MG PO TABS: ORAL_TABLET | ORAL | 0 refills | Status: DC

## 2017-05-19 ENCOUNTER — Other Ambulatory Visit: Payer: Self-pay | Admitting: Family Medicine

## 2017-05-19 ENCOUNTER — Other Ambulatory Visit: Payer: Self-pay | Admitting: *Deleted

## 2017-05-19 DIAGNOSIS — Z780 Asymptomatic menopausal state: Secondary | ICD-10-CM

## 2017-05-19 DIAGNOSIS — G4709 Other insomnia: Secondary | ICD-10-CM

## 2017-05-19 DIAGNOSIS — Z1231 Encounter for screening mammogram for malignant neoplasm of breast: Secondary | ICD-10-CM

## 2017-05-19 MED ORDER — ZOLPIDEM TARTRATE 5 MG PO TABS: ORAL_TABLET | ORAL | 1 refills | Status: DC

## 2017-05-19 NOTE — Telephone Encounter (Signed)
Routed to pcp for signature.Zahir Eisenhour Lynetta, CMA  

## 2017-06-07 ENCOUNTER — Encounter: Payer: Self-pay | Admitting: Family Medicine

## 2017-06-07 ENCOUNTER — Ambulatory Visit (INDEPENDENT_AMBULATORY_CARE_PROVIDER_SITE_OTHER): Payer: Medicare Other | Admitting: Family Medicine

## 2017-06-07 VITALS — BP 156/57 | HR 65 | Ht 60.0 in | Wt 113.0 lb

## 2017-06-07 DIAGNOSIS — R5382 Chronic fatigue, unspecified: Secondary | ICD-10-CM | POA: Diagnosis not present

## 2017-06-07 DIAGNOSIS — R51 Headache: Secondary | ICD-10-CM | POA: Diagnosis not present

## 2017-06-07 DIAGNOSIS — E7800 Pure hypercholesterolemia, unspecified: Secondary | ICD-10-CM | POA: Diagnosis not present

## 2017-06-07 DIAGNOSIS — E559 Vitamin D deficiency, unspecified: Secondary | ICD-10-CM | POA: Diagnosis not present

## 2017-06-07 DIAGNOSIS — Z0000 Encounter for general adult medical examination without abnormal findings: Secondary | ICD-10-CM

## 2017-06-07 DIAGNOSIS — E039 Hypothyroidism, unspecified: Secondary | ICD-10-CM

## 2017-06-07 DIAGNOSIS — Z5181 Encounter for therapeutic drug level monitoring: Secondary | ICD-10-CM

## 2017-06-07 DIAGNOSIS — R519 Headache, unspecified: Secondary | ICD-10-CM

## 2017-06-07 MED ORDER — PAROXETINE HCL 20 MG PO TABS: 20.0000 mg | ORAL_TABLET | Freq: Every day | ORAL | 1 refills | Status: DC

## 2017-06-07 NOTE — Progress Notes (Signed)
Subjective:   Christine [name] is a 77 y.o. female who presents for Medicare Annual (Subsequent) preventive examination.  She is Christine [name] scheduled her DEXA scan and her mammogram for this summer.  She does have a dental exam scheduled as well.  Is not had an eye exam in about 3 years.  Review of Systems:  The review of systems is negative except for HPI.       Objective:     Vitals: BP (!) 156/57   Pulse 65   Ht 5' (1.524 m)   Wt 113 lb (51.3 kg)   SpO2 100%   BMI 22.07 kg/m   Body mass index is 22.07 kg/m.  Advanced Directives 06/07/2017 04/01/2015  Does Patient Have a Medical Advance Directive? Yes Yes  Type of Advance Directive Healthcare Power of Attorney Living will  Copy of Healthcare Power of Attorney in Chart? No - copy requested No - copy requested    Tobacco Social History   Tobacco Use  Smoking Status Never Smoker  Smokeless Tobacco Never Used     Counseling given: Not Answered   Clinical Intake:    Physical Exam  Constitutional: She is oriented to person, place, and time. She appears well-developed and well-nourished.  HENT:  Head: Normocephalic and atraumatic.  Right Ear: External ear normal.  Left Ear: External ear normal.  Nose: Nose normal.  Mouth/Throat: Oropharynx is clear and moist.  TMs and canals are clear.   Eyes: Pupils are equal, round, and reactive to light. Conjunctivae and EOM are normal.  Neck: Neck supple. No thyromegaly present.  Cardiovascular: Normal rate, regular rhythm and normal heart sounds.  Pulmonary/Chest: Effort normal and breath sounds normal. She has no wheezes.  Lymphadenopathy:    She has no cervical adenopathy.  Neurological: She is alert and oriented to person, place, and time.  Skin: Skin is warm and dry.  Psychiatric: She has a normal mood and affect.                      Past Medical History:  Diagnosis Date  . Depression   . Hyperlipidemia   . Hypertriglyceridemia   . Insomnia   .  Osteopenia    Past Surgical History:  Procedure Laterality Date  . ABDOMINAL HYSTERECTOMY  1991  . COSMETIC SURGERY  1997   liposuction   Family History  Problem Relation Age of Onset  . Aneurysm Mother        abdominal  . Heart failure Mother   . Lung cancer Father        smoker   Social History   Socioeconomic History  . Marital status: Widowed    Spouse name: Molly Maduro  . Number of children: 2  . Years of education: 92  . Highest education level: Not on file  Occupational History  . Occupation: Still working    Employer: [employer]  Social Needs  . Financial resource strain: Not on file  . Food insecurity:    Worry: Not on file    Inability: Not on file  . Transportation needs:    Medical: Not on file    Non-medical: Not on file  Tobacco Use  . Smoking status: Never Smoker  . Smokeless tobacco: Never Used  Substance and Sexual Activity  . Alcohol use: No  . Drug use: No  . Sexual activity: Not on file  Lifestyle  . Physical activity:    Days per week: Not on file  Minutes per session: Not on file  . Stress: Not on file  Relationships  . Social connections:    Talks on phone: Not on file    Gets together: Not on file    Attends religious service: Not on file    Active member of club or organization: Not on file    Attends meetings of clubs or organizations: Not on file    Relationship status: Not on file  Other Topics Concern  . Not on file  Social History Narrative   1 caffeine drink per day.  No regular exercise.    Outpatient Encounter Medications as of 06/07/2017  Medication Sig  . atorvastatin (LIPITOR) 10 MG tablet Take 1 tablet (10 mg total) by mouth daily.  Marland Kitchen ibandronate (BONIVA) 150 MG tablet take 1 tablet by mouth every month IN THE MORNING WITH A FULL GLASS OF WATER ON AN EMPTY STOMACH  . levothyroxine (SYNTHROID, LEVOTHROID) 50 MCG tablet Take 1 tablet (50 mcg total) by mouth daily before breakfast.  . PARoxetine (PAXIL) 20  MG tablet Take 1 tablet (20 mg total) by mouth daily.  Marland Kitchen rOPINIRole (REQUIP) 0.25 MG tablet Take 1 tablet (0.25 mg total) by mouth at bedtime.  Marland Kitchen zolpidem (AMBIEN) 5 MG tablet TAKE 1 TABLET BY MOUTH AT BEDTIME IF NEEDED FOR SLEEP  . [DISCONTINUED] PARoxetine (PAXIL) 20 MG tablet Take 1 tablet (20 mg total) by mouth daily.   No facility-administered encounter medications on file as of 06/07/2017.     Activities of Daily Living In your present state of health, do you have any difficulty performing the following activities: 06/07/2017 07/23/2016  Hearing? N N  Vision? N N  Difficulty concentrating or making decisions? N N  Walking or climbing stairs? N N  Dressing or bathing? N N  Doing errands, shopping? N N  Some recent data might be hidden    Patient Care Team: Agapito Games, MD as PCP - General    Assessment:   This is a routine wellness examination for Christine [name].  Exercise Activities and Dietary recommendations Current Exercise Habits: The patient does not participate in regular exercise at present  Goals    None      Fall Risk Fall Risk  06/07/2017 04/22/2016 04/01/2015 03/27/2014 01/23/2013  Falls in the past year? No No Yes No No  Number falls in past yr: - - 1 - -  Injury with Fall? - - No - -  Risk for fall due to : - - Other (Comment) - -  Follow up - - Falls prevention discussed - -   I Depression Screen PHQ 2/9 Scores 06/07/2017 04/26/2017 12/21/2016 07/22/2016  PHQ - 2 Score 2 6 0 1  PHQ- 9 Score 7 18 10  -     Cognitive Function     6CIT Screen 06/07/2017 07/22/2016  What Year? 0 points 0 points  What month? 0 points 0 points  What time? 0 points 0 points  Count back from 20 0 points 0 points  Months in reverse 0 points 0 points  Repeat phrase 0 points 2 points  Total Score 0 2    Immunization History  Administered Date(s) Administered  . Influenza Split 11/19/2011  . Influenza Whole 11/05/2008  . Influenza, High Dose Seasonal PF 11/20/2014, 11/21/2014,  12/17/2016  . Influenza,inj,Quad PF,6+ Mos 01/23/2013, 10/15/2013, 11/14/2015  . Pneumococcal Conjugate-13 09/25/2014  . Pneumococcal Polysaccharide-23 02/15/2009  . Td 01/04/2006  . Tdap 12/17/2015    Qualifies for Shingles Vaccine? Yes,  handout given.   Screening Tests Health Maintenance  Topic Date Due  . INFLUENZA VACCINE  09/15/2017  . MAMMOGRAM  04/22/2018  . COLONOSCOPY  10/28/2024  . TETANUS/TDAP  12/16/2025  . DEXA SCAN  Completed  . PNA vac Low Risk Adult  Completed    Cancer Screenings: Lung: Low Dose CT Chest recommended if Age 26-80 years, 30 pack-year currently smoking OR have quit w/in 15years. Patient does not qualify. Breast:  Up to date on Mammogram? Yes   Up to date of Bone Density/Dexa? Yes Colorectal: Yes, up to date.    Additional Screenings:  Hepatitis C Screening:      Plan:     I have personally reviewed and noted the following in the patient's chart:   . Medical and social history . Use of alcohol, tobacco or illicit drugs  . Current medications and supplements . Functional ability and status . Nutritional status . Physical activity - no regular exercise.  . Advanced directives - up dated.   . List of other physicians . Hospitalizations, surgeries, and ER visits in previous 12 months . Vitals . Screenings to include cognitive, depression, and falls . Referrals and appointments . Please schedule eye exam . Right sided headaches. -Unclear etiology.  Did encourage her to get an eye exams that she has not had one in about 3 years.  Also her blood pressure was a little elevated so encouraged her to keep an eye on that as well.  Not improving then please let me know. . Chronic fatigue - check labs . Elevated BP - recheck BP in 2 weeks.   Arlana Lindau. Vit D - recheck labs.    In addition, I have reviewed and discussed with patient certain preventive protocols, quality metrics, and best practice recommendations. A written personalized care plan for  preventive services as well as general preventive health recommendations were provided to patient.     Nani Gasseratherine Carlita Whitcomb, MD  06/07/2017

## 2017-06-07 NOTE — Patient Instructions (Addendum)
Please try to schedule an eye exam.  If your headaches are not improving then please let me know.

## 2017-06-08 LAB — LIPID PANEL
CHOL/HDL RATIO: 2.4 (calc) (ref <5.0)
Cholesterol: 137 mg/dL (ref <200)
HDL: 56 mg/dL (ref >50)
LDL Cholesterol (Calc): 65 mg/dL (calc)
NON-HDL CHOLESTEROL (CALC): 81 mg/dL (ref <130)
TRIGLYCERIDES: 75 mg/dL (ref <150)

## 2017-06-08 LAB — COMPLETE METABOLIC PANEL WITH GFR
AG Ratio: 1.7 (calc) (ref 1.0–2.5)
ALBUMIN MSPROF: 4.5 g/dL (ref 3.6–5.1)
ALKALINE PHOSPHATASE (APISO): 79 U/L (ref 33–130)
ALT: 13 U/L (ref 6–29)
AST: 19 U/L (ref 10–35)
BILIRUBIN TOTAL: 0.7 mg/dL (ref 0.2–1.2)
BUN: 9 mg/dL (ref 7–25)
CO2: 29 mmol/L (ref 20–32)
CREATININE: 0.73 mg/dL (ref 0.60–0.93)
Calcium: 9.2 mg/dL (ref 8.6–10.4)
Chloride: 104 mmol/L (ref 98–110)
GFR, EST NON AFRICAN AMERICAN: 81 mL/min/{1.73_m2} (ref >=60)
GFR, Est African American: 94 mL/min/{1.73_m2} (ref >=60)
GLOBULIN: 2.7 g/dL (ref 1.9–3.7)
Glucose, Bld: 83 mg/dL (ref 65–99)
Potassium: 3.7 mmol/L (ref 3.5–5.3)
Sodium: 139 mmol/L (ref 135–146)
TOTAL PROTEIN: 7.2 g/dL (ref 6.1–8.1)

## 2017-06-08 LAB — TSH: TSH: 0.07 mIU/L — ABNORMAL LOW (ref 0.40–4.50)

## 2017-06-08 LAB — B12 AND FOLATE PANEL
Folate: 14.4 ng/mL
Vitamin B-12: 436 pg/mL (ref 200–1100)

## 2017-06-08 LAB — VITAMIN D 25 HYDROXY (VIT D DEFICIENCY, FRACTURES): Vit D, 25-Hydroxy: 14 ng/mL — ABNORMAL LOW (ref 30–100)

## 2017-06-10 ENCOUNTER — Other Ambulatory Visit: Payer: Self-pay | Admitting: *Deleted

## 2017-06-10 DIAGNOSIS — R899 Unspecified abnormal finding in specimens from other organs, systems and tissues: Secondary | ICD-10-CM

## 2017-06-22 ENCOUNTER — Encounter: Payer: Self-pay | Admitting: Family Medicine

## 2017-06-22 ENCOUNTER — Ambulatory Visit (INDEPENDENT_AMBULATORY_CARE_PROVIDER_SITE_OTHER): Payer: Medicare Other | Admitting: Family Medicine

## 2017-06-22 VITALS — BP 157/45 | HR 67

## 2017-06-22 DIAGNOSIS — R030 Elevated blood-pressure reading, without diagnosis of hypertension: Secondary | ICD-10-CM | POA: Diagnosis not present

## 2017-06-22 NOTE — Progress Notes (Signed)
Agree with documentation as above.   Evelyne Makepeace, MD  

## 2017-06-22 NOTE — Progress Notes (Signed)
Pt came into clinic today for BP check. At last OV, her BP was elevated. She did have a nurse from [facility] come to her home yesterday and her BP was noted to be different in her left and right arm. Left arm reading was 132/70, right arm reading 150/68. Pt's bp today at goal, same in both arms. Per PCP, she does need to increase her water intake to bring up her diastolic number. No further questions or concerns. Pt advised to follow up as directed at last OV.

## 2017-07-18 ENCOUNTER — Other Ambulatory Visit: Payer: Self-pay | Admitting: Family Medicine

## 2017-07-20 ENCOUNTER — Ambulatory Visit (INDEPENDENT_AMBULATORY_CARE_PROVIDER_SITE_OTHER): Payer: Medicare Other

## 2017-07-20 DIAGNOSIS — Z780 Asymptomatic menopausal state: Secondary | ICD-10-CM

## 2017-07-20 DIAGNOSIS — Z1231 Encounter for screening mammogram for malignant neoplasm of breast: Secondary | ICD-10-CM

## 2017-07-20 DIAGNOSIS — M810 Age-related osteoporosis without current pathological fracture: Secondary | ICD-10-CM

## 2017-10-04 ENCOUNTER — Other Ambulatory Visit: Payer: Self-pay | Admitting: Family Medicine

## 2017-11-25 ENCOUNTER — Other Ambulatory Visit: Payer: Self-pay | Admitting: Family Medicine

## 2017-12-26 ENCOUNTER — Ambulatory Visit (INDEPENDENT_AMBULATORY_CARE_PROVIDER_SITE_OTHER): Payer: Medicare Other | Admitting: Family Medicine

## 2017-12-26 ENCOUNTER — Encounter: Payer: Self-pay | Admitting: Family Medicine

## 2017-12-26 VITALS — BP 138/57 | HR 80 | Ht <= 58 in | Wt 114.0 lb

## 2017-12-26 DIAGNOSIS — G4709 Other insomnia: Secondary | ICD-10-CM

## 2017-12-26 DIAGNOSIS — Z23 Encounter for immunization: Secondary | ICD-10-CM | POA: Diagnosis not present

## 2017-12-26 DIAGNOSIS — F411 Generalized anxiety disorder: Secondary | ICD-10-CM

## 2017-12-26 DIAGNOSIS — M25511 Pain in right shoulder: Secondary | ICD-10-CM

## 2017-12-26 MED ORDER — SERTRALINE HCL 50 MG PO TABS: ORAL_TABLET | ORAL | 2 refills | Status: DC

## 2017-12-26 MED ORDER — ROPINIROLE HCL 0.25 MG PO TABS: 0.2500 mg | ORAL_TABLET | Freq: Every day | ORAL | 1 refills | Status: DC

## 2017-12-26 MED ORDER — ZOLPIDEM TARTRATE 5 MG PO TABS: ORAL_TABLET | ORAL | 1 refills | Status: DC

## 2017-12-26 NOTE — Progress Notes (Signed)
Subjective:    CC: RLS and Anxiety   HPI:  77 year old female is here to follow-up for couple different concerns.  In regards to her restless leg syndrome she has been off of the medication for months.  She says it starting to really bother her again and she like to restart the ropinirole.   He also reports feeling anxious.  She feels like over half the time she feels nervous and on edge and several days of the week she is having difficulty controlling her worry and feeling like she is having difficulty relaxing and feeling restless.  Reports some occasional irritability.  Her sister who lives in [place] and has dementia was just taken by her children out of state very abruptly.  She is been very upset about the situation she feels like long-term it would have been better for her to be able to stay at home and have may be a sitter or someone stay with her.  She also complains of right shoulder pain.  She says getting to the point where it is painful to sleep on it.  She does not remember a specific injury or trauma.  She is able to reach up above her shoulder without difficulty.  Insomnia-she is also asking for refill on her zolpidem.  She does take it nightly and denies any side effects.  Past medical history, Surgical history, Family history not pertinant except as noted below, Social history, Allergies, and medications have been entered into the medical record, reviewed, and corrections made.   Review of Systems: No fevers, chills, night sweats, weight loss, chest pain, or shortness of breath.   Objective:    General: Well Developed, well nourished, and in no acute distress.  Neuro: Alert and oriented x3, extra-ocular muscles intact, sensation grossly intact.  HEENT: Normocephalic, atraumatic  Skin: Warm and dry, no rashes. Cardiac: Regular rate and rhythm, no murmurs rubs or gallops, no lower extremity edema.  Respiratory: Clear to auscultation bilaterally. Not using accessory muscles,  speaking in full sentences. MSK: Right shoulder with normal range of motion.  Negative empty can test.  She did have significant weakness with liftoff test compared to her left arm.  Normal range of motion and no pain with external and internal rotation.  She is just tender over the lateral edge of the acromium.   Impression and Recommendations:     Restless leg syndrome-we will refill ropinirole will start with 0.25 since she is been off of it for quite a while.  I did let her know that we could adjust the dose upward depending on how well it is controlling her symptoms.  Anxiety-she would like to restart her sertraline.  New prescription sent to pharmacy.  As we adjust her dose and we can send in a 90-day prescription if needed.  Shoulder pain, right-suspect bursitis-recommend exercises, anti-inflammatory and ice or heat whichever feels better for the next few weeks.  If not improving then please let us know and we will get her in with 1 of our sports med docs for possible injection.  Insomnia-Ambien refilled.  Flu vaccine given today.

## 2018-01-30 ENCOUNTER — Ambulatory Visit: Payer: Medicare Other | Admitting: Family Medicine

## 2018-02-06 ENCOUNTER — Other Ambulatory Visit: Payer: Self-pay | Admitting: *Deleted

## 2018-02-06 MED ORDER — SERTRALINE HCL 50 MG PO TABS: 50.0000 mg | ORAL_TABLET | Freq: Every day | ORAL | 2 refills | Status: DC

## 2018-04-20 ENCOUNTER — Other Ambulatory Visit: Payer: Self-pay | Admitting: Family Medicine

## 2018-04-20 DIAGNOSIS — M810 Age-related osteoporosis without current pathological fracture: Secondary | ICD-10-CM

## 2018-05-19 ENCOUNTER — Encounter: Payer: Self-pay | Admitting: Family Medicine

## 2018-05-19 ENCOUNTER — Telehealth: Payer: Self-pay

## 2018-05-19 ENCOUNTER — Telehealth (INDEPENDENT_AMBULATORY_CARE_PROVIDER_SITE_OTHER): Payer: Medicare Other | Admitting: Family Medicine

## 2018-05-19 ENCOUNTER — Other Ambulatory Visit: Payer: Self-pay

## 2018-05-19 DIAGNOSIS — J22 Unspecified acute lower respiratory infection: Secondary | ICD-10-CM | POA: Diagnosis not present

## 2018-05-19 DIAGNOSIS — J302 Other seasonal allergic rhinitis: Secondary | ICD-10-CM | POA: Diagnosis not present

## 2018-05-19 DIAGNOSIS — R05 Cough: Secondary | ICD-10-CM | POA: Diagnosis not present

## 2018-05-19 DIAGNOSIS — R059 Cough, unspecified: Secondary | ICD-10-CM

## 2018-05-19 MED ORDER — PREDNISONE 20 MG PO TABS: 40.0000 mg | ORAL_TABLET | Freq: Every day | ORAL | 0 refills | Status: DC

## 2018-05-19 MED ORDER — DOXYCYCLINE HYCLATE 100 MG PO TABS: 100.0000 mg | ORAL_TABLET | Freq: Two times a day (BID) | ORAL | 0 refills | Status: DC

## 2018-05-19 NOTE — Progress Notes (Signed)
Virtual Visit via Video Note  I connected with Christine [name] on 05/19/18 at  2:40 PM EDT by a video enabled telemedicine application and verified that I am speaking with the correct person using two identifiers.   I discussed the limitations of evaluation and management by telemedicine and the availability of in person appointments. The patient expressed understanding and agreed to proceed.  Subjective:    CC: Cough  HPI:  Cough and wheezing x 2 weeks. She feels SOB when she talks a lot. Post nasal drip.  Has had similar symptoms a couple of years ago and actually ended up being referred to ENT.  They felt like it was allergy triggered but that had turned into a bronchial infection.  She feels like she is at that point where she is starting to feel a little worse and she is noticing wheezing..  She has been taking zyrtec.  She hasn't traveled or been around anyone.  She hasn't been outside because of the pollen.  No diarrhea.  No fever, chills or sweats.  He has no known prior history of underlying asthma or COPD.  Past medical history, Surgical history, Family history not pertinant except as noted below, Social history, Allergies, and medications have been entered into the medical record, reviewed, and corrections made.   Review of Systems: No fevers, chills, night sweats, weight loss, chest pain, or shortness of breath.   Objective:    General: Speaking clearly in complete sentences without any shortness of breath.  Alert and oriented x3.  Normal judgment. No apparent acute distress. Well groomed.  She is coughing into to cloth.      Impression and Recommendations:    Seasonal allergies/cough x 3 weeks, getting worse - will tx with prednisone and doxycycline.  Did recommend that she continue with her allergy medication.  If not improving after the next 5 to 6 days and please give [place] a call back.  Also encouraged her to call back sooner if she feels like she is getting worse, develops  more shortness of breath or has a fever.  Lower respiratory tract infection-we will treat with doxycycline and prednisone.  I think she is low risk overall for coded.    I discussed the assessment and treatment plan with the patient. The patient was provided an opportunity to ask questions and all were answered. The patient agreed with the plan and demonstrated an understanding of the instructions.   The patient was advised to call back or seek an in-person evaluation if the symptoms worsen or if the condition fails to improve as anticipated.   Nani Gasser, MD

## 2018-05-19 NOTE — Telephone Encounter (Signed)
Plese put on my schedule for 2:40 and then we can "arrive them before front office leaves and I will do Mychart at that time.

## 2018-05-19 NOTE — Telephone Encounter (Signed)
Christine [name] will scheduled and call patient.

## 2018-05-19 NOTE — Telephone Encounter (Signed)
Christine [name] called and states she has a dry cough and some wheezing. She believes it is due to allergies. Denies fever.There are no appointments available today. Please advise.

## 2018-06-07 ENCOUNTER — Other Ambulatory Visit: Payer: Self-pay | Admitting: Family Medicine

## 2018-07-03 ENCOUNTER — Other Ambulatory Visit: Payer: Self-pay

## 2018-07-03 DIAGNOSIS — G4709 Other insomnia: Secondary | ICD-10-CM

## 2018-07-03 MED ORDER — ZOLPIDEM TARTRATE 5 MG PO TABS: ORAL_TABLET | ORAL | 0 refills | Status: DC

## 2018-07-03 NOTE — Telephone Encounter (Signed)
Christine [name] is requesting a refill on Ambien. She states she is due for a physical but wants to wait until later due to the COVID-19.

## 2018-08-24 ENCOUNTER — Ambulatory Visit (INDEPENDENT_AMBULATORY_CARE_PROVIDER_SITE_OTHER): Payer: Medicare Other

## 2018-08-24 ENCOUNTER — Ambulatory Visit (INDEPENDENT_AMBULATORY_CARE_PROVIDER_SITE_OTHER): Payer: Medicare Other | Admitting: Family Medicine

## 2018-08-24 ENCOUNTER — Encounter: Payer: Self-pay | Admitting: Family Medicine

## 2018-08-24 ENCOUNTER — Other Ambulatory Visit: Payer: Self-pay

## 2018-08-24 VITALS — Ht <= 58 in

## 2018-08-24 DIAGNOSIS — E559 Vitamin D deficiency, unspecified: Secondary | ICD-10-CM

## 2018-08-24 DIAGNOSIS — R053 Chronic cough: Secondary | ICD-10-CM

## 2018-08-24 DIAGNOSIS — E7800 Pure hypercholesterolemia, unspecified: Secondary | ICD-10-CM | POA: Diagnosis not present

## 2018-08-24 DIAGNOSIS — Z0000 Encounter for general adult medical examination without abnormal findings: Secondary | ICD-10-CM

## 2018-08-24 DIAGNOSIS — R413 Other amnesia: Secondary | ICD-10-CM

## 2018-08-24 DIAGNOSIS — E039 Hypothyroidism, unspecified: Secondary | ICD-10-CM

## 2018-08-24 DIAGNOSIS — R05 Cough: Secondary | ICD-10-CM

## 2018-08-24 DIAGNOSIS — Z1231 Encounter for screening mammogram for malignant neoplasm of breast: Secondary | ICD-10-CM

## 2018-08-24 NOTE — Progress Notes (Signed)
Subjective:     Christine [name] is a 78 y.o. female and is here for a comprehensive physical exam. The patient reports problems - she is concerned about her memory.  She is just noticed a few little changes such as opening the blinds in the morning sometimes she will forget if she is Artie been in the basement open the blinds or not.  It is part of her daily routine.  She is not missed appointments other major problems.  But she is concerned because she has 2 sisters with Alzheimer's dementia.  Still concerned because she still experiencing a persistent cough it is been going on since about March.  It always seems to be worse at night.  She does sit out on her deck in the mornings and numbness wonders if it could be some type of allergen.  She says the cough is mostly dry.  And sometimes she will feel like there is a sensation of something stuck particularly in the right side of her throat.  But she never really seems to feel that there is anything truly there.  Really more of a sensation.  No problems with food getting stuck no dysphasia.  She does take Zyrtec daily.  And back in April had actually sent and some doxycycline and prednisone thinking that it might be more of a bronchitis/sinusitis.  She said it did help for a couple of weeks.  She is planning on scheduling her mammogram in the next couple of weeks.  Social History   Socioeconomic History  . Marital status: Widowed    Spouse name: Molly MaduroRobert  . Number of children: 2  . Years of education: 4313  . Highest education level: Not on file  Occupational History  . Occupation: Still working    [employer]er[employer]  Social Needs  . Financial resource strain: Not on file  . Food insecurity    Worry: Not on file    Inability: Not on file  . Transportation needs    Medical: Not on file    Non-medical: Not on file  Tobacco Use  . Smoking status: Never Smoker  . Smokeless tobacco: Never Used  Substance and Sexual Activity  .  Alcohol use: No  . Drug use: No  . Sexual activity: Not on file  Lifestyle  . Physical activity    Days per week: Not on file    Minutes per session: Not on file  . Stress: Not on file  Relationships  . Social Musicianconnections    Talks on phone: Not on file    Gets together: Not on file    Attends religious service: Not on file    Active member of club or organization: Not on file    Attends meetings of clubs or organizations: Not on file    Relationship status: Not on file  . Intimate partner violence    Fear of current or ex partner: Not on file    Emotionally abused: Not on file    Physically abused: Not on file    Forced sexual activity: Not on file  Other Topics Concern  . Not on file  Social History Narrative   1 caffeine drink per day.  No regular exercise.   Health Maintenance  Topic Date Due  . INFLUENZA VACCINE  09/16/2018  . TETANUS/TDAP  12/16/2025  . DEXA SCAN  Completed  . PNA vac Low Risk Adult  Completed    The following portions of the patient's history were reviewed  and updated as appropriate: allergies, current medications, past family history, past medical history, past social history, past surgical history and problem list.  Review of Systems A comprehensive review of systems was negative.   Objective:    Ht 4\' 10"  (1.473 m)   BMI 23.83 kg/m  General appearance: alert, cooperative and appears stated age Head: Normocephalic, without obvious abnormality, atraumatic Eyes: conj clear, EOMI, PEERLA Ears: normal TM's and external ear canals both ears Nose: Nares normal. Septum midline. Mucosa normal. No drainage or sinus tenderness. Throat: lips, mucosa, and tongue normal; teeth and gums normal Neck: no adenopathy, no carotid bruit, no JVD, supple, symmetrical, trachea midline and thyroid not enlarged, symmetric, no tenderness/mass/nodules Back: symmetric, no curvature. ROM normal. No CVA tenderness. Lungs: clear to auscultation bilaterally Breasts:  normal appearance, no masses or tenderness Heart: regular rate and rhythm, S1, S2 normal, no murmur, click, rub or gallop Abdomen: soft, non-tender; bowel sounds normal; no masses,  no organomegaly Extremities: extremities normal, atraumatic, no cyanosis or edema Pulses: 2+ and symmetric Skin: Skin color, texture, turgor normal. No rashes or lesions Lymph nodes: Cervical, supraclavicular, and axillary nodes normal. Neurologic: Alert and oriented X 3, normal strength and tone. Normal symmetric reflexes. Normal coordination and gait    Assessment:    Healthy female exam.      Plan:     See After Visit Summary for Counseling Recommendations   Keep up a regular exercise program and make sure you are eating a healthy diet Try to eat 4 servings of dairy a day, or if you are lactose intolerant take a calcium with vitamin D daily.  Your vaccines are up to date.   Chronic cough - lung exam is clear.  Will work up further.  Will get chest x-ray and schedule for spirometry.  Cough seems to be worse at night so consider reflux and postnasal drip in the differential as well.  Consider asthma.  Memory change-just noticing some small differences currently.  Her 8 it was normal.  When she comes back in for spirometry we can do a full Mini-Mental status exam we will do some additional labs today just to rule out deficiencies thyroid abnormality etc.

## 2018-08-24 NOTE — Patient Instructions (Signed)
Health Maintenance After Age 76 After age 76, you are at a higher risk for certain long-term diseases and infections as well as injuries from falls. Falls are a major cause of broken bones and head injuries in people who are older than age 76. Getting regular preventive care can help to keep you healthy and well. Preventive care includes getting regular testing and making lifestyle changes as recommended by your health care provider. Talk with your health care provider about:  Which screenings and tests you should have. A screening is a test that checks for a disease when you have no symptoms.  A diet and exercise plan that is right for you. What should I know about screenings and tests to prevent falls? Screening and testing are the best ways to find a health problem early. Early diagnosis and treatment give you the best chance of managing medical conditions that are common after age 76. Certain conditions and lifestyle choices may make you more likely to have a fall. Your health care provider may recommend:  Regular vision checks. Poor vision and conditions such as cataracts can make you more likely to have a fall. If you wear glasses, make sure to get your prescription updated if your vision changes.  Medicine review. Work with your health care provider to regularly review all of the medicines you are taking, including over-the-counter medicines. Ask your health care provider about any side effects that may make you more likely to have a fall. Tell your health care provider if any medicines that you take make you feel dizzy or sleepy.  Osteoporosis screening. Osteoporosis is a condition that causes the bones to get weaker. This can make the bones weak and cause them to break more easily.  Blood pressure screening. Blood pressure changes and medicines to control blood pressure can make you feel dizzy.  Strength and balance checks. Your health care provider may recommend certain tests to check your  strength and balance while standing, walking, or changing positions.  Foot health exam. Foot pain and numbness, as well as not wearing proper footwear, can make you more likely to have a fall.  Depression screening. You may be more likely to have a fall if you have a fear of falling, feel emotionally low, or feel unable to do activities that you used to do.  Alcohol use screening. Using too much alcohol can affect your balance and may make you more likely to have a fall. What actions can I take to lower my risk of falls? General instructions  Talk with your health care provider about your risks for falling. Tell your health care provider if: ? You fall. Be sure to tell your health care provider about all falls, even ones that seem minor. ? You feel dizzy, sleepy, or off-balance.  Take over-the-counter and prescription medicines only as told by your health care provider. These include any supplements.  Eat a healthy diet and maintain a healthy weight. A healthy diet includes low-fat dairy products, low-fat (lean) meats, and fiber from whole grains, beans, and lots of fruits and vegetables. Home safety  Remove any tripping hazards, such as rugs, cords, and clutter.  Install safety equipment such as grab bars in bathrooms and safety rails on stairs.  Keep rooms and walkways well-lit. Activity   Follow a regular exercise program to stay fit. This will help you maintain your balance. Ask your health care provider what types of exercise are appropriate for you.  If you need a cane or   walker, use it as recommended by your health care provider.  Wear supportive shoes that have nonskid soles. Lifestyle  Do not drink alcohol if your health care provider tells you not to drink.  If you drink alcohol, limit how much you have: ? 0-1 drink a day for women. ? 0-2 drinks a day for men.  Be aware of how much alcohol is in your drink. In the [place] one drink equals one typical bottle of beer (12  oz), one-half glass of wine (5 oz), or one shot of hard liquor (1 oz).  Do not use any products that contain nicotine or tobacco, such as cigarettes and e-cigarettes. If you need help quitting, ask your health care provider. Summary  Having a healthy lifestyle and getting preventive care can help to protect your health and wellness after age 76.  Screening and testing are the best way to find a health problem early and help you avoid having a fall. Early diagnosis and treatment give you the best chance for managing medical conditions that are more common for people who are older than age 76.  Falls are a major cause of broken bones and head injuries in people who are older than age 76. Take precautions to prevent a fall at home.  Work with your health care provider to learn what changes you can make to improve your health and wellness and to prevent falls. This information is not intended to replace advice given to you by your health care provider. Make sure you discuss any questions you have with your health care provider. Document Released: 12/15/2016 Document Revised: 05/25/2018 Document Reviewed: 12/15/2016 Elsevier Patient Education  2020 Elsevier Inc.  

## 2018-08-25 ENCOUNTER — Other Ambulatory Visit: Payer: Self-pay | Admitting: Family Medicine

## 2018-08-25 LAB — COMPLETE METABOLIC PANEL WITH GFR
AG Ratio: 1.7 (calc) (ref 1.0–2.5)
ALT: 14 U/L (ref 6–29)
AST: 16 U/L (ref 10–35)
Albumin: 4.5 g/dL (ref 3.6–5.1)
Alkaline phosphatase (APISO): 64 U/L (ref 37–153)
BUN: 11 mg/dL (ref 7–25)
CO2: 28 mmol/L (ref 20–32)
Calcium: 10 mg/dL (ref 8.6–10.4)
Chloride: 105 mmol/L (ref 98–110)
Creat: 0.79 mg/dL (ref 0.60–0.93)
GFR, Est African American: 84 mL/min/{1.73_m2} (ref >=60)
GFR, Est Non African American: 73 mL/min/{1.73_m2} (ref >=60)
Globulin: 2.6 g/dL (calc) (ref 1.9–3.7)
Glucose, Bld: 85 mg/dL (ref 65–99)
Potassium: 4.2 mmol/L (ref 3.5–5.3)
Sodium: 141 mmol/L (ref 135–146)
Total Bilirubin: 0.5 mg/dL (ref 0.2–1.2)
Total Protein: 7.1 g/dL (ref 6.1–8.1)

## 2018-08-25 LAB — TSH: TSH: 0.59 mIU/L (ref 0.40–4.50)

## 2018-08-25 LAB — VITAMIN B12: Vitamin B-12: 559 pg/mL (ref 200–1100)

## 2018-08-25 LAB — LIPID PANEL
Cholesterol: 170 mg/dL (ref <200)
HDL: 52 mg/dL (ref >=50)
LDL Cholesterol (Calc): 97 mg/dL (calc)
Non-HDL Cholesterol (Calc): 118 mg/dL (calc) (ref <130)
Total CHOL/HDL Ratio: 3.3 (calc) (ref <5.0)
Triglycerides: 111 mg/dL (ref <150)

## 2018-08-25 LAB — CBC
HCT: 39.5 % (ref 35.0–45.0)
Hemoglobin: 12.7 g/dL (ref 11.7–15.5)
MCH: 29.9 pg (ref 27.0–33.0)
MCHC: 32.2 g/dL (ref 32.0–36.0)
MCV: 92.9 fL (ref 80.0–100.0)
MPV: 10.7 fL (ref 7.5–12.5)
Platelets: 360 10*3/uL (ref 140–400)
RBC: 4.25 10*6/uL (ref 3.80–5.10)
RDW: 12.7 % (ref 11.0–15.0)
WBC: 5.9 10*3/uL (ref 3.8–10.8)

## 2018-08-25 LAB — VITAMIN D 25 HYDROXY (VIT D DEFICIENCY, FRACTURES): Vit D, 25-Hydroxy: 19 ng/mL — ABNORMAL LOW (ref 30–100)

## 2018-08-25 LAB — FERRITIN: Ferritin: 148 ng/mL (ref 16–288)

## 2018-08-25 LAB — MAGNESIUM: Magnesium: 1.9 mg/dL (ref 1.5–2.5)

## 2018-08-25 LAB — RPR: RPR Ser Ql: NONREACTIVE

## 2018-09-28 ENCOUNTER — Other Ambulatory Visit: Payer: Self-pay

## 2018-09-28 ENCOUNTER — Ambulatory Visit (INDEPENDENT_AMBULATORY_CARE_PROVIDER_SITE_OTHER): Payer: Medicare Other

## 2018-09-28 DIAGNOSIS — Z1231 Encounter for screening mammogram for malignant neoplasm of breast: Secondary | ICD-10-CM | POA: Diagnosis not present

## 2018-10-03 ENCOUNTER — Other Ambulatory Visit: Payer: Self-pay | Admitting: *Deleted

## 2018-10-03 DIAGNOSIS — G4709 Other insomnia: Secondary | ICD-10-CM

## 2018-10-03 MED ORDER — ZOLPIDEM TARTRATE 5 MG PO TABS: ORAL_TABLET | ORAL | 0 refills | Status: DC

## 2018-11-09 ENCOUNTER — Other Ambulatory Visit: Payer: Self-pay | Admitting: Family Medicine

## 2018-11-09 DIAGNOSIS — M810 Age-related osteoporosis without current pathological fracture: Secondary | ICD-10-CM

## 2018-12-08 ENCOUNTER — Other Ambulatory Visit: Payer: Self-pay

## 2018-12-08 ENCOUNTER — Ambulatory Visit (INDEPENDENT_AMBULATORY_CARE_PROVIDER_SITE_OTHER): Payer: Medicare Other | Admitting: Family Medicine

## 2018-12-08 DIAGNOSIS — Z23 Encounter for immunization: Secondary | ICD-10-CM

## 2018-12-08 MED ORDER — SHINGRIX 50 MCG/0.5ML IM SUSR: 0.5000 mL | Freq: Once | INTRAMUSCULAR | 1 refills | Status: AC

## 2018-12-08 NOTE — Progress Notes (Signed)
Pt also requested Shingrix, Rx sent to pharmacy due to insurance.

## 2018-12-08 NOTE — Addendum Note (Signed)
Addended by: Lavon Paganini on: 12/08/2018 08:42 AM   Modules accepted: Orders

## 2018-12-28 ENCOUNTER — Other Ambulatory Visit: Payer: Self-pay | Admitting: Family Medicine

## 2019-01-29 ENCOUNTER — Other Ambulatory Visit: Payer: Self-pay | Admitting: Family Medicine

## 2019-01-29 ENCOUNTER — Other Ambulatory Visit: Payer: Self-pay

## 2019-01-29 DIAGNOSIS — G4709 Other insomnia: Secondary | ICD-10-CM

## 2019-01-29 MED ORDER — ZOLPIDEM TARTRATE 5 MG PO TABS: ORAL_TABLET | ORAL | 0 refills | Status: DC

## 2019-03-06 ENCOUNTER — Ambulatory Visit: Payer: Medicare Other | Attending: Internal Medicine

## 2019-03-06 DIAGNOSIS — Z23 Encounter for immunization: Secondary | ICD-10-CM

## 2019-03-06 NOTE — Progress Notes (Signed)
   Covid-19 Vaccination Clinic  Name:  Jaunice Mirza    MRN: [id number] DOB: 08/25/1940  03/06/2019  Ms. Hussey was observed post Covid-19 immunization for 15 minutes without incidence. She was provided with Vaccine Information Sheet and instruction to access the V-Safe system.   Ms. Gallaga was instructed to call 911 with any severe reactions post vaccine: Marland Kitchen Difficulty breathing  . Swelling of your face and throat  . A fast heartbeat  . A bad rash all over your body  . Dizziness and weakness    Immunizations Administered    Name Date Dose VIS Date Route   Pfizer COVID-19 Vaccine 03/06/2019  9:27 AM 0.3 mL 01/26/2019 Intramuscular   Manufacturer: [employer], [employer]   Lot: [id number]   NDC: [address]-0

## 2019-03-27 ENCOUNTER — Ambulatory Visit: Payer: Medicare Other | Attending: Internal Medicine

## 2019-03-27 DIAGNOSIS — Z23 Encounter for immunization: Secondary | ICD-10-CM

## 2019-03-27 NOTE — Progress Notes (Signed)
   Covid-19 Vaccination Clinic  Name:  Christine [name]    MRN: [id number] DOB: 10-Feb-1941  03/27/2019  [name] was observed post Covid-19 immunization for 15 minutes without incidence. She was provided with Vaccine Information Sheet and instruction to access the V-Safe system.   [name] was instructed to call 911 with any severe reactions post vaccine: Marland Kitchen Difficulty breathing  . Swelling of your face and throat  . A fast heartbeat  . A bad rash all over your body  . Dizziness and weakness    Immunizations Administered    Name Date Dose VIS Date Route   Pfizer COVID-19 Vaccine 03/27/2019  8:33 AM 0.3 mL 01/26/2019 Intramuscular   Manufacturer: [employer], [employer]   Lot: [id number]   NDC: [address]-4

## 2019-06-04 ENCOUNTER — Telehealth: Payer: Self-pay

## 2019-06-04 DIAGNOSIS — G4709 Other insomnia: Secondary | ICD-10-CM

## 2019-06-04 DIAGNOSIS — E039 Hypothyroidism, unspecified: Secondary | ICD-10-CM

## 2019-06-04 MED ORDER — LEVOTHYROXINE SODIUM 50 MCG PO TABS: 50.0000 ug | ORAL_TABLET | Freq: Every day | ORAL | 0 refills | Status: DC

## 2019-06-04 MED ORDER — ZOLPIDEM TARTRATE 5 MG PO TABS: ORAL_TABLET | ORAL | 0 refills | Status: DC

## 2019-06-04 NOTE — Telephone Encounter (Signed)
Pt called and stated that her insurance company is now making her use [facility] instead of [facility] for medications. The Rx's were transferred but she was told that we needed to authorize and resend new Rx's for levothyroxine 50 mcg and zolpidem 5 mg. Pharmacy has been updated in her chart.   Pt's last OV was 08/24/2018. I do not see a notation in regards to when she should RTC for a f/up. Rx's have been tee'd up below for a #90 day supply and is ready for review, correction, and approval/denial.

## 2019-06-04 NOTE — Telephone Encounter (Signed)
Medication sent but she does need to schedule follow-up appointment.

## 2019-06-04 NOTE — Telephone Encounter (Signed)
Sent note to Shanda Bumps to have her to schedule f/u appt.

## 2019-06-07 ENCOUNTER — Other Ambulatory Visit: Payer: Self-pay

## 2019-06-07 ENCOUNTER — Encounter: Payer: Self-pay | Admitting: Family Medicine

## 2019-06-07 ENCOUNTER — Telehealth: Payer: Self-pay | Admitting: Family Medicine

## 2019-06-07 ENCOUNTER — Ambulatory Visit (INDEPENDENT_AMBULATORY_CARE_PROVIDER_SITE_OTHER): Payer: Medicare Other | Admitting: Family Medicine

## 2019-06-07 VITALS — BP 148/70 | HR 68 | Wt 128.0 lb

## 2019-06-07 DIAGNOSIS — F5101 Primary insomnia: Secondary | ICD-10-CM | POA: Diagnosis not present

## 2019-06-07 DIAGNOSIS — E039 Hypothyroidism, unspecified: Secondary | ICD-10-CM | POA: Diagnosis not present

## 2019-06-07 DIAGNOSIS — R209 Unspecified disturbances of skin sensation: Secondary | ICD-10-CM | POA: Diagnosis not present

## 2019-06-07 DIAGNOSIS — G4709 Other insomnia: Secondary | ICD-10-CM

## 2019-06-07 DIAGNOSIS — Z818 Family history of other mental and behavioral disorders: Secondary | ICD-10-CM

## 2019-06-07 DIAGNOSIS — R5383 Other fatigue: Secondary | ICD-10-CM

## 2019-06-07 MED ORDER — ZOLPIDEM TARTRATE 5 MG PO TABS: ORAL_TABLET | ORAL | 0 refills | Status: DC

## 2019-06-07 NOTE — Telephone Encounter (Signed)
Received message from patient Christine [name] needed a PA sent through cover my meds waiting on determination. - CF

## 2019-06-07 NOTE — Progress Notes (Signed)
Established Patient Office Visit  Subjective:  Patient ID: Christine [name], female    DOB: Mar 16, 1940  Age: 79 y.o. MRN: [id number]  CC: No chief complaint on file.   HPI Christine [name] presents for follow-up insomnia.  She is currently on Ambien 5 mg daily she tolerates it well without any side effects or problems.  Hypothyroidism - Taking medication regularly in the AM away from food and vitamins, etc. No recent change to skin, hair, or energy levels.  She has noticed over the last couple months that she has been noticing that her left foot feels cold.  She says both hands feel cold as well but this is normal for her is not unusual but the foot feeling cold all the time is new it is mostly over the distal foot.  No injury or trauma.  No known history of cardiovascular or peripheral vascular disease.  She has not noticed any discoloration or color change when this occurs.  He said she will sleep with a heating pad at night sometimes to keep the feet warm.  Past Medical History:  Diagnosis Date  . Depression   . Hyperlipidemia   . Hypertriglyceridemia   . Insomnia   . Osteopenia     Past Surgical History:  Procedure Laterality Date  . ABDOMINAL HYSTERECTOMY  1991  . COSMETIC SURGERY  1997   liposuction    Family History  Problem Relation Age of Onset  . Aneurysm Mother        abdominal  . Heart failure Mother   . Lung cancer Father        smoker    Social History   Socioeconomic History  . Marital status: Widowed    Spouse name: Herbie Baltimore  . Number of children: 2  . Years of education: 36  . Highest education level: Not on file  Occupational History  . Occupation: Still working    Employer: [employer]  Tobacco Use  . Smoking status: Never Smoker  . Smokeless tobacco: Never Used  Substance and Sexual Activity  . Alcohol use: No  . Drug use: No  . Sexual activity: Not on file  Other Topics Concern  . Not on file  Social History Narrative   1  caffeine drink per day.  No regular exercise.   Social Determinants of Health   Financial Resource Strain:   . Difficulty of Paying Living Expenses:   Food Insecurity:   . Worried About Charity fundraiser in the Last Year:   . Arboriculturist in the Last Year:   Transportation Needs:   . Film/video editor (Medical):   Marland Kitchen Lack of Transportation (Non-Medical):   Physical Activity:   . Days of Exercise per Week:   . Minutes of Exercise per Session:   Stress:   . Feeling of Stress :   Social Connections:   . Frequency of Communication with Friends and Family:   . Frequency of Social Gatherings with Friends and Family:   . Attends Religious Services:   . Active Member of Clubs or Organizations:   . Attends Archivist Meetings:   Marland Kitchen Marital Status:   Intimate Partner Violence:   . Fear of Current or Ex-Partner:   . Emotionally Abused:   Marland Kitchen Physically Abused:   . Sexually Abused:     Outpatient Medications Prior to Visit  Medication Sig Dispense Refill  . atorvastatin (LIPITOR) 10 MG tablet TAKE 1 TABLET DAILY (DUE FOR FOLLOW UP)  90 tablet 3  . ibandronate (BONIVA) 150 MG tablet TAKE 1 TABLET EVERY MONTH IN THE MORNING WITH A FULL GLASS OF WATER ON AN EMPTY STOMACH 12 tablet 1  . levothyroxine (SYNTHROID) 50 MCG tablet Take 1 tablet (50 mcg total) by mouth daily before breakfast. 90 tablet 0  . rOPINIRole (REQUIP) 0.25 MG tablet TAKE 1 TABLET AT BEDTIME 90 tablet 3  . doxycycline (VIBRA-TABS) 100 MG tablet Take 1 tablet (100 mg total) by mouth 2 (two) times daily. 20 tablet 0  . predniSONE (DELTASONE) 20 MG tablet Take 2 tablets (40 mg total) by mouth daily with breakfast. 10 tablet 0  . zolpidem (AMBIEN) 5 MG tablet TAKE 1 TABLET BY MOUTH AT BEDTIME IF NEEDED FOR SLEEP 90 tablet 0   No facility-administered medications prior to visit.    Allergies  Allergen Reactions  . Paroxetine Nausea Only and Other (See Comments)    Headache,weight loss, loss of appetite      ROS Review of Systems    Objective:    Physical Exam  Constitutional: She is oriented to person, place, and time. She appears well-developed and well-nourished.  HENT:  Head: Normocephalic and atraumatic.  Cardiovascular: Normal rate, regular rhythm and normal heart sounds.  Pulmonary/Chest: Effort normal and breath sounds normal.  Musculoskeletal:     Comments: Left foot and ankle with normal appearance no swelling.  Dorsal pedal pulse and posterior tibial pulse 2+.  Good capillary refill at the toes but they do feel cool to touch.  No discoloration of the skin or rash.  Neurological: She is alert and oriented to person, place, and time.  Skin: Skin is warm and dry.  Psychiatric: She has a normal mood and affect. Her behavior is normal.    BP (!) 148/70 (BP Location: Left Arm, Patient Position: Sitting, Cuff Size: Normal)   Pulse 68   Wt 128 lb (58.1 kg)   SpO2 100%   BMI 26.75 kg/m  Wt Readings from Last 3 Encounters:  06/07/19 128 lb (58.1 kg)  12/26/17 114 lb (51.7 kg)  06/07/17 113 lb (51.3 kg)     There are no preventive care reminders to display for this patient.  There are no preventive care reminders to display for this patient.  Lab Results  Component Value Date   TSH 0.59 08/24/2018   Lab Results  Component Value Date   WBC 5.9 08/24/2018   HGB 12.7 08/24/2018   HCT 39.5 08/24/2018   MCV 92.9 08/24/2018   PLT 360 08/24/2018   Lab Results  Component Value Date   NA 141 08/24/2018   K 4.2 08/24/2018   CO2 28 08/24/2018   GLUCOSE 85 08/24/2018   BUN 11 08/24/2018   CREATININE 0.79 08/24/2018   BILITOT 0.5 08/24/2018   ALKPHOS 68 04/21/2016   AST 16 08/24/2018   ALT 14 08/24/2018   PROT 7.1 08/24/2018   ALBUMIN 4.5 04/21/2016   CALCIUM 10.0 08/24/2018   Lab Results  Component Value Date   CHOL 170 08/24/2018   Lab Results  Component Value Date   HDL 52 08/24/2018   Lab Results  Component Value Date   LDLCALC 97 08/24/2018   Lab  Results  Component Value Date   TRIG 111 08/24/2018   Lab Results  Component Value Date   CHOLHDL 3.3 08/24/2018   No results found for: HGBA1C    Assessment & Plan:   Problem List Items Addressed This Visit      Endocrine  Hypothyroidism    Last TSH looked great but I would like to recheck it today since she has had some new onset of cold feet which could be a symptom of a thyroid problem or her levels being off.        Other   Insomnia - Primary    Doing well on 5 mg Ambien.  We actually just sent refills about 2 days ago because she was about to run out she has not received those from mail order yet so we will send over 2-week supply here locally.      Relevant Medications   zolpidem (AMBIEN) 5 MG tablet   Family history of dementia    She also now has 2 sisters that have been diagnosed with dementia.  She is concerned about her own mental health in this regard.  She has not noticed anything concerning or being forgetful etc. but now that her second sisters been recently diagnosed this has been weighing heavily on her mind.  We discussed the importance of controlling her blood pressure which was elevated today so I definitely want keep an eye on that.  We also will make sure that her cholesterol is staying well controlled she is getting come back for physical in a few months and we will recheck levels at that time her last LDL was less than 100 and on her statin.  We also discussed the importance of healthy diet as well as regular exercise.  Unfortunately genetics are the things that we cannot really control we can certainly control some of these other lifestyle factors which could reduce her risk for stroke.  She has no known peripheral vascular disease or heart disease.       Other Visit Diagnoses    Fatigue, unspecified type       Relevant Orders   TSH   CBC with Differential/Platelet   Cold feet       Relevant Orders   TSH   CBC with Differential/Platelet      Fatigue-she does complain of feeling more fatigued than usual but also admits she has not been getting out much and has not been as active with Covid.  Just really encourage her to start an exercise routine.  If she can get outdoors that would be great but she also has seen no salt allergies and has difficulty being out in the pollen this time of year.  Cold feet-particularly the left foot-unclear etiology good pulses on exam today.  No prior history of cardiovascular or peripheral vascular disease.  We will check her TSH to make sure that her levels are well controlled if that is normal then move forward with ABIs.  Meds ordered this encounter  Medications  . zolpidem (AMBIEN) 5 MG tablet    Sig: TAKE 1 TABLET BY MOUTH AT BEDTIME IF NEEDED FOR SLEEP    Dispense:  15 tablet    Refill:  0    Follow-up: Return in about 3 months (around 08/25/2019) for CPE.    Nani Gasser, MD

## 2019-06-07 NOTE — Assessment & Plan Note (Signed)
Doing well on 5 mg Ambien.  We actually just sent refills about 2 days ago because she was about to run out she has not received those from mail order yet so we will send over 2-week supply here locally.

## 2019-06-07 NOTE — Assessment & Plan Note (Signed)
Last TSH looked great but I would like to recheck it today since she has had some new onset of cold feet which could be a symptom of a thyroid problem or her levels being off.

## 2019-06-07 NOTE — Progress Notes (Signed)
Pt states overall she's healthy.   Concerns about feeling tired often. Cold extremities Testing for possible future dementia  She is fasting today.  Requesting: Ambien refill

## 2019-06-07 NOTE — Assessment & Plan Note (Signed)
She also now has 2 sisters that have been diagnosed with dementia.  She is concerned about her own mental health in this regard.  She has not noticed anything concerning or being forgetful etc. but now that her second sisters been recently diagnosed this has been weighing heavily on her mind.  We discussed the importance of controlling her blood pressure which was elevated today so I definitely want keep an eye on that.  We also will make sure that her cholesterol is staying well controlled she is getting come back for physical in a few months and we will recheck levels at that time her last LDL was less than 100 and on her statin.  We also discussed the importance of healthy diet as well as regular exercise.  Unfortunately genetics are the things that we cannot really control we can certainly control some of these other lifestyle factors which could reduce her risk for stroke.  She has no known peripheral vascular disease or heart disease.

## 2019-06-08 LAB — CBC WITH DIFFERENTIAL/PLATELET
Absolute Monocytes: 592 cells/uL (ref 200–950)
Basophils Absolute: 68 cells/uL (ref 0–200)
Basophils Relative: 1 %
Eosinophils Absolute: 170 cells/uL (ref 15–500)
Eosinophils Relative: 2.5 %
HCT: 41.7 % (ref 35.0–45.0)
Hemoglobin: 13.2 g/dL (ref 11.7–15.5)
Lymphs Abs: 1788 cells/uL (ref 850–3900)
MCH: 29.1 pg (ref 27.0–33.0)
MCHC: 31.7 g/dL — ABNORMAL LOW (ref 32.0–36.0)
MCV: 92.1 fL (ref 80.0–100.0)
MPV: 10.8 fL (ref 7.5–12.5)
Monocytes Relative: 8.7 %
Neutro Abs: 4182 cells/uL (ref 1500–7800)
Neutrophils Relative %: 61.5 %
Platelets: 360 10*3/uL (ref 140–400)
RBC: 4.53 10*6/uL (ref 3.80–5.10)
RDW: 13.1 % (ref 11.0–15.0)
Total Lymphocyte: 26.3 %
WBC: 6.8 10*3/uL (ref 3.8–10.8)

## 2019-06-08 LAB — TSH: TSH: 0.4 mIU/L (ref 0.40–4.50)

## 2019-06-12 NOTE — Telephone Encounter (Signed)
Received fax from [facility] and zolpidem was denied due to patient has to try and fail Belsomra first. Placing in providers box for review. -CF

## 2019-06-13 MED ORDER — ZOLPIDEM TARTRATE 5 MG PO TABS: ORAL_TABLET | ORAL | 0 refills | Status: DC

## 2019-06-13 NOTE — Addendum Note (Signed)
Addended by: Chalmers Cater on: 06/13/2019 02:08 PM   Modules accepted: Orders

## 2019-06-13 NOTE — Telephone Encounter (Signed)
Remedy states she would like to have a 90 day to send to [facility]. Good Rx coupon  zolpidem 5mg  90 tablets  Discounted price with this coupon$[id number]  Member Karin Golden  [id number]  [id number]  PCNGDC

## 2019-06-13 NOTE — Telephone Encounter (Signed)
Patient advised, prescription sent.

## 2019-06-13 NOTE — Telephone Encounter (Signed)
Christine [name] called and left a message requesting we do an appeal for the Ambien. She states this medication works and she wishes to not change.

## 2019-06-13 NOTE — Addendum Note (Signed)
Addended by: Nani Gasser D on: 06/13/2019 02:15 PM   Modules accepted: Orders

## 2019-06-13 NOTE — Telephone Encounter (Signed)
We can move forward with an appeal but please let her know that sometimes especially with Medicare some of my patients just pay out of pocket further Ambien if needed.

## 2019-07-07 ENCOUNTER — Other Ambulatory Visit: Payer: Self-pay | Admitting: Family Medicine

## 2019-07-07 DIAGNOSIS — E039 Hypothyroidism, unspecified: Secondary | ICD-10-CM

## 2019-09-04 ENCOUNTER — Encounter: Payer: Self-pay | Admitting: Family Medicine

## 2019-09-04 ENCOUNTER — Other Ambulatory Visit: Payer: Self-pay

## 2019-09-04 ENCOUNTER — Telehealth: Payer: Self-pay | Admitting: Family Medicine

## 2019-09-04 ENCOUNTER — Ambulatory Visit (INDEPENDENT_AMBULATORY_CARE_PROVIDER_SITE_OTHER): Payer: Medicare Other | Admitting: Family Medicine

## 2019-09-04 ENCOUNTER — Other Ambulatory Visit: Payer: Self-pay | Admitting: Family Medicine

## 2019-09-04 VITALS — BP 146/60 | HR 59 | Ht <= 58 in | Wt 124.0 lb

## 2019-09-04 DIAGNOSIS — Z818 Family history of other mental and behavioral disorders: Secondary | ICD-10-CM

## 2019-09-04 DIAGNOSIS — Z13220 Encounter for screening for lipoid disorders: Secondary | ICD-10-CM

## 2019-09-04 DIAGNOSIS — M810 Age-related osteoporosis without current pathological fracture: Secondary | ICD-10-CM | POA: Diagnosis not present

## 2019-09-04 DIAGNOSIS — G4709 Other insomnia: Secondary | ICD-10-CM

## 2019-09-04 DIAGNOSIS — E039 Hypothyroidism, unspecified: Secondary | ICD-10-CM | POA: Diagnosis not present

## 2019-09-04 DIAGNOSIS — Z1231 Encounter for screening mammogram for malignant neoplasm of breast: Secondary | ICD-10-CM

## 2019-09-04 DIAGNOSIS — R5383 Other fatigue: Secondary | ICD-10-CM

## 2019-09-04 MED ORDER — IBANDRONATE SODIUM 150 MG PO TABS: ORAL_TABLET | ORAL | 1 refills | Status: DC

## 2019-09-04 MED ORDER — ZOLPIDEM TARTRATE 5 MG PO TABS: ORAL_TABLET | ORAL | 1 refills | Status: DC

## 2019-09-04 NOTE — Assessment & Plan Note (Signed)
Discussed again the importance of just staying engaged mentally having conversations, being at least somewhat social even though she is quite content to be home.  Reading things that are challenging.  Keeping her blood pressure under really good and tight control.  Taking her cholesterol medication.  Resting well and getting regular exercise.

## 2019-09-04 NOTE — Assessment & Plan Note (Signed)
We did refill her Ambien today.

## 2019-09-04 NOTE — Assessment & Plan Note (Signed)
Will refill Boniva.  She says she ran out a couple of months ago and does need to start taking it again.  Her last bone density was in 2019 so it has been 2 years.

## 2019-09-04 NOTE — Progress Notes (Signed)
Established Patient Office Visit  Subjective:  Patient ID: Christine [name], female    DOB: Dec 16, 1940  Age: 79 y.o. MRN: [id number]  CC:  Chief Complaint  Patient presents with  . Annual Exam    HPI Christine [name] presents for CPE. She is doing well but does have some concerns today.  She just has felt very fatigued and does not like she has a lot of energy.  She mostly stays at home and does puzzles and reads and does daily housework.  She does spend a couple hours a day talking with friends and family and church members on the phone but does not really leave the house very often in fact she says she really only goes to the grocery store about once a month.  She does not exercise regularly.  She feels like overall she sleeps okay.  She is very concerned about her memory she is now have 2 sisters who were diagnosed with dementia.  And she feels like she is becoming a little bit more forgetful for herself and is very worried about developing advanced dementia.  Past Medical History:  Diagnosis Date  . Depression   . Hyperlipidemia   . Hypertriglyceridemia   . Insomnia   . Osteopenia     Past Surgical History:  Procedure Laterality Date  . ABDOMINAL HYSTERECTOMY  1991  . COSMETIC SURGERY  1997   liposuction    Family History  Problem Relation Age of Onset  . Aneurysm Mother        abdominal  . Heart failure Mother   . Lung cancer Father        smoker    Social History   Socioeconomic History  . Marital status: Widowed    Spouse name: Molly Maduro  . Number of children: 2  . Years of education: 26  . Highest education level: Not on file  Occupational History  . Occupation: Still working    Employer: [employer]  Tobacco Use  . Smoking status: Never Smoker  . Smokeless tobacco: Never Used  Substance and Sexual Activity  . Alcohol use: No  . Drug use: No  . Sexual activity: Not on file  Other Topics Concern  . Not on file  Social History Narrative   1  caffeine drink per day.  No regular exercise.   Social Determinants of Health   Financial Resource Strain:   . Difficulty of Paying Living Expenses:   Food Insecurity:   . Worried About Programme researcher, broadcasting/film/video in the Last Year:   . Barista in the Last Year:   Transportation Needs:   . Freight forwarder (Medical):   Marland Kitchen Lack of Transportation (Non-Medical):   Physical Activity:   . Days of Exercise per Week:   . Minutes of Exercise per Session:   Stress:   . Feeling of Stress :   Social Connections:   . Frequency of Communication with Friends and Family:   . Frequency of Social Gatherings with Friends and Family:   . Attends Religious Services:   . Active Member of Clubs or Organizations:   . Attends Banker Meetings:   Marland Kitchen Marital Status:   Intimate Partner Violence:   . Fear of Current or Ex-Partner:   . Emotionally Abused:   Marland Kitchen Physically Abused:   . Sexually Abused:     Outpatient Medications Prior to Visit  Medication Sig Dispense Refill  . atorvastatin (LIPITOR) 10 MG tablet TAKE 1 TABLET  BY MOUTH  DAILY 90 tablet 3  . cetirizine (ZYRTEC) 10 MG tablet Take 10 mg by mouth daily.    . Cholecalciferol (VITAMIN D3) 50 MCG (2000 UT) capsule Take 2,000 Units by mouth daily.    Marland Kitchen esomeprazole (NEXIUM 24HR) 20 MG capsule Take 20 mg by mouth as needed.    Marland Kitchen levothyroxine (SYNTHROID) 50 MCG tablet TAKE 1 TABLET BY MOUTH  DAILY BEFORE BREAKFAST 90 tablet 3  . Multiple Vitamins-Minerals (ONE-A-DAY WOMENS 50+ ADVANTAGE PO) Take 1 tablet by mouth daily.    . vitamin B-12 (CYANOCOBALAMIN) 1000 MCG tablet Take 1,000 mcg by mouth daily.    Marland Kitchen ibandronate (BONIVA) 150 MG tablet TAKE 1 TABLET EVERY MONTH IN THE MORNING WITH A FULL GLASS OF WATER ON AN EMPTY STOMACH 12 tablet 1  . zolpidem (AMBIEN) 5 MG tablet TAKE 1 TABLET BY MOUTH AT BEDTIME IF NEEDED FOR SLEEP 90 tablet 0  . rOPINIRole (REQUIP) 0.25 MG tablet TAKE 1 TABLET AT BEDTIME 90 tablet 3   No  facility-administered medications prior to visit.    Allergies  Allergen Reactions  . Paroxetine Nausea Only and Other (See Comments)    Headache,weight loss, loss of appetite     ROS Review of Systems    Objective:    Physical Exam Constitutional:      Appearance: She is well-developed.  HENT:     Head: Normocephalic and atraumatic.     Right Ear: External ear normal.     Left Ear: External ear normal.     Nose: Nose normal.  Eyes:     Conjunctiva/sclera: Conjunctivae normal.     Pupils: Pupils are equal, round, and reactive to light.  Neck:     Thyroid: No thyromegaly.  Cardiovascular:     Rate and Rhythm: Normal rate and regular rhythm.     Heart sounds: Normal heart sounds.  Pulmonary:     Effort: Pulmonary effort is normal.     Breath sounds: Normal breath sounds. No wheezing.  Abdominal:     General: Abdomen is flat. Bowel sounds are normal.     Palpations: Abdomen is soft.  Musculoskeletal:     Cervical back: Neck supple.  Lymphadenopathy:     Cervical: No cervical adenopathy.  Skin:    General: Skin is warm and dry.  Neurological:     Mental Status: She is alert and oriented to person, place, and time.  Psychiatric:        Behavior: Behavior normal.     BP (!) 146/60   Pulse (!) 59   Ht 4\' 10"  (1.473 m)   Wt 124 lb (56.2 kg)   SpO2 100%   BMI 25.92 kg/m  Wt Readings from Last 3 Encounters:  09/04/19 124 lb (56.2 kg)  06/07/19 128 lb (58.1 kg)  12/26/17 114 lb (51.7 kg)     Health Maintenance Due  Topic Date Due  . Hepatitis C Screening  Never done    There are no preventive care reminders to display for this patient.  Lab Results  Component Value Date   TSH 0.40 06/07/2019   Lab Results  Component Value Date   WBC 6.8 06/07/2019   HGB 13.2 06/07/2019   HCT 41.7 06/07/2019   MCV 92.1 06/07/2019   PLT 360 06/07/2019   Lab Results  Component Value Date   NA 141 08/24/2018   K 4.2 08/24/2018   CO2 28 08/24/2018   GLUCOSE 85  08/24/2018   BUN 11 08/24/2018  CREATININE 0.79 08/24/2018   BILITOT 0.5 08/24/2018   ALKPHOS 68 04/21/2016   AST 16 08/24/2018   ALT 14 08/24/2018   PROT 7.1 08/24/2018   ALBUMIN 4.5 04/21/2016   CALCIUM 10.0 08/24/2018   Lab Results  Component Value Date   CHOL 170 08/24/2018   Lab Results  Component Value Date   HDL 52 08/24/2018   Lab Results  Component Value Date   LDLCALC 97 08/24/2018   Lab Results  Component Value Date   TRIG 111 08/24/2018   Lab Results  Component Value Date   CHOLHDL 3.3 08/24/2018   No results found for: HGBA1C    Assessment & Plan:   Problem List Items Addressed This Visit      Endocrine   Hypothyroidism - Primary   Relevant Orders   TSH     Musculoskeletal and Integument   Osteoporosis    Will refill Boniva.  She says she ran out a couple of months ago and does need to start taking it again.  Her last bone density was in 2019 so it has been 2 years.      Relevant Medications   ibandronate (BONIVA) 150 MG tablet   Cholecalciferol (VITAMIN D3) 50 MCG (2000 UT) capsule     Other   Insomnia    We did refill her Ambien today.      Relevant Medications   zolpidem (AMBIEN) 5 MG tablet   Family history of dementia    Discussed again the importance of just staying engaged mentally having conversations, being at least somewhat social even though she is quite content to be home.  Reading things that are challenging.  Keeping her blood pressure under really good and tight control.  Taking her cholesterol medication.  Resting well and getting regular exercise.       Other Visit Diagnoses    Screening, lipid       Relevant Orders   Lipid panel   COMPLETE METABOLIC PANEL WITH GFR   Fatigue, unspecified type       Relevant Orders   TSH   Lipid panel   COMPLETE METABOLIC PANEL WITH GFR     Keep up a regular exercise program and make sure you are eating a healthy diet Try to eat 4 servings of dairy a day, or if you are lactose  intolerant take a calcium with vitamin D daily.  Your vaccines are up to date.  She is due for her mammogram next month so reminded her about that.  Her colonoscopy is up-to-date.  Her vaccines are up-to-date as well.  Did encourage her to really work on creating a red exercise routine for herself   Osteoporosis Will refill Boniva.  She says she ran out a couple of months ago and does need to start taking it again.  Her last bone density was in 2019 so it has been 2 years.  Family history of dementia Discussed again the importance of just staying engaged mentally having conversations, being at least somewhat social even though she is quite content to be home.  Reading things that are challenging.  Keeping her blood pressure under really good and tight control.  Taking her cholesterol medication.  Resting well and getting regular exercise.  Insomnia We did refill her Ambien today.    Meds ordered this encounter  Medications  . ibandronate (BONIVA) 150 MG tablet    Sig: TAKE 1 TABLET EVERY MONTH IN THE MORNING WITH A FULL GLASS OF WATER ON  AN EMPTY STOMACH    Dispense:  12 tablet    Refill:  1  . zolpidem (AMBIEN) 5 MG tablet    Sig: TAKE 1 TABLET BY MOUTH AT BEDTIME IF NEEDED FOR SLEEP    Dispense:  90 tablet    Refill:  1    [facility] Charlyne [facility]    Follow-up: Return in about 2 weeks (around 09/18/2019) for nurse visit for bp check.    Nani Gasseratherine Maymunah Stegemann, MD

## 2019-09-04 NOTE — Telephone Encounter (Signed)
Please call patient and have her follow-up for blood pressure check for nurse visit in 2 weeks.  Her blood pressure has been elevated the last for 5 times that she has been here and just 1 to make sure that it is under really good control.

## 2019-09-05 LAB — LIPID PANEL
Cholesterol: 149 mg/dL (ref <200)
HDL: 51 mg/dL (ref >=50)
LDL Cholesterol (Calc): 79 mg/dL (calc)
Non-HDL Cholesterol (Calc): 98 mg/dL (calc) (ref <130)
Total CHOL/HDL Ratio: 2.9 (calc) (ref <5.0)
Triglycerides: 107 mg/dL (ref <150)

## 2019-09-05 LAB — TSH: TSH: 0.77 mIU/L (ref 0.40–4.50)

## 2019-09-05 LAB — COMPLETE METABOLIC PANEL WITH GFR
AG Ratio: 1.7 (calc) (ref 1.0–2.5)
ALT: 10 U/L (ref 6–29)
AST: 17 U/L (ref 10–35)
Albumin: 4.9 g/dL (ref 3.6–5.1)
Alkaline phosphatase (APISO): 85 U/L (ref 37–153)
BUN: 10 mg/dL (ref 7–25)
CO2: 26 mmol/L (ref 20–32)
Calcium: 10.7 mg/dL — ABNORMAL HIGH (ref 8.6–10.4)
Chloride: 103 mmol/L (ref 98–110)
Creat: 0.83 mg/dL (ref 0.60–0.93)
GFR, Est African American: 79 mL/min/{1.73_m2} (ref >=60)
GFR, Est Non African American: 68 mL/min/{1.73_m2} (ref >=60)
Globulin: 2.9 g/dL (calc) (ref 1.9–3.7)
Glucose, Bld: 93 mg/dL (ref 65–99)
Potassium: 4.4 mmol/L (ref 3.5–5.3)
Sodium: 139 mmol/L (ref 135–146)
Total Bilirubin: 0.6 mg/dL (ref 0.2–1.2)
Total Protein: 7.8 g/dL (ref 6.1–8.1)

## 2019-09-05 NOTE — Telephone Encounter (Signed)
Patient already has this appointment down for 08/03.

## 2019-09-18 ENCOUNTER — Ambulatory Visit: Payer: Medicare Other

## 2019-09-19 ENCOUNTER — Telehealth: Payer: Self-pay | Admitting: Family Medicine

## 2019-09-19 ENCOUNTER — Ambulatory Visit (INDEPENDENT_AMBULATORY_CARE_PROVIDER_SITE_OTHER): Payer: Medicare Other | Admitting: Family Medicine

## 2019-09-19 ENCOUNTER — Other Ambulatory Visit: Payer: Self-pay

## 2019-09-19 VITALS — BP 138/55 | HR 54 | Ht <= 58 in | Wt 123.0 lb

## 2019-09-19 DIAGNOSIS — R0602 Shortness of breath: Secondary | ICD-10-CM | POA: Diagnosis not present

## 2019-09-19 DIAGNOSIS — R030 Elevated blood-pressure reading, without diagnosis of hypertension: Secondary | ICD-10-CM | POA: Diagnosis not present

## 2019-09-19 MED ORDER — ALBUTEROL SULFATE HFA 108 (90 BASE) MCG/ACT IN AERS
4.0000 | INHALATION_SPRAY | Freq: Once | RESPIRATORY_TRACT | Status: AC
  Administered 2019-09-19: 4 via RESPIRATORY_TRACT

## 2019-09-19 NOTE — Assessment & Plan Note (Signed)
Performed spirometry today.  FVC of 72%, FEV1 of 72%, with ratio of 73%.  No significant improvement after albuterol.  There was a 30% improvement in PEF.  Effort was fair but not excellent.  She has never been a smoker.  We discussed that there is some evidence of mild restriction but otherwise normal spirometry no COPD or asthma.  Did discuss becoming more active to really improve her pulmonary function.  Like to repeat this in 1 year.

## 2019-09-19 NOTE — Progress Notes (Signed)
Established Patient Office Visit  Subjective:  Patient ID: Keiasia Christianson, female    DOB: December 25, 1940  Age: 79 y.o. MRN: [id number]  CC:  Chief Complaint  Patient presents with  . Shortness of Breath    HPI Rocsi Hazelbaker presents for spirometry.  At her last office visit she had complained that she had been noticing a little bit more shortness of breath with activity.  No recent chest pain.  No lower extremity swelling.  Normal echocardiogram in 2015.  No prior history of asthma etc.  He is also here for blood pressure follow-up.  Blood pressure was mildly elevated at 146/60 again at her last office visit.  She is not currently on prescription medication for hypertension.  Past Medical History:  Diagnosis Date  . Depression   . Hyperlipidemia   . Hypertriglyceridemia   . Insomnia   . Osteopenia     Past Surgical History:  Procedure Laterality Date  . ABDOMINAL HYSTERECTOMY  1991  . COSMETIC SURGERY  1997   liposuction    Family History  Problem Relation Age of Onset  . Aneurysm Mother        abdominal  . Heart failure Mother   . Lung cancer Father        smoker    Social History   Socioeconomic History  . Marital status: Widowed    Spouse name: Molly Maduro  . Number of children: 2  . Years of education: 80  . Highest education level: Not on file  Occupational History  . Occupation: Still working    Employer: [employer]  Tobacco Use  . Smoking status: Never Smoker  . Smokeless tobacco: Never Used  Substance and Sexual Activity  . Alcohol use: No  . Drug use: No  . Sexual activity: Not on file  Other Topics Concern  . Not on file  Social History Narrative   1 caffeine drink per day.  No regular exercise.   Social Determinants of Health   Financial Resource Strain:   . Difficulty of Paying Living Expenses:   Food Insecurity:   . Worried About Programme researcher, broadcasting/film/video in the Last Year:   . Barista in the Last Year:   Transportation  Needs:   . Freight forwarder (Medical):   Marland Kitchen Lack of Transportation (Non-Medical):   Physical Activity:   . Days of Exercise per Week:   . Minutes of Exercise per Session:   Stress:   . Feeling of Stress :   Social Connections:   . Frequency of Communication with Friends and Family:   . Frequency of Social Gatherings with Friends and Family:   . Attends Religious Services:   . Active Member of Clubs or Organizations:   . Attends Banker Meetings:   Marland Kitchen Marital Status:   Intimate Partner Violence:   . Fear of Current or Ex-Partner:   . Emotionally Abused:   Marland Kitchen Physically Abused:   . Sexually Abused:     Outpatient Medications Prior to Visit  Medication Sig Dispense Refill  . atorvastatin (LIPITOR) 10 MG tablet TAKE 1 TABLET BY MOUTH  DAILY 90 tablet 3  . cetirizine (ZYRTEC) 10 MG tablet Take 10 mg by mouth daily.    . Cholecalciferol (VITAMIN D3) 50 MCG (2000 UT) capsule Take 2,000 Units by mouth daily.    Marland Kitchen esomeprazole (NEXIUM 24HR) 20 MG capsule Take 20 mg by mouth as needed.    . ibandronate (BONIVA) 150 MG tablet  TAKE 1 TABLET EVERY MONTH IN THE MORNING WITH A FULL GLASS OF WATER ON AN EMPTY STOMACH 12 tablet 1  . levothyroxine (SYNTHROID) 50 MCG tablet TAKE 1 TABLET BY MOUTH  DAILY BEFORE BREAKFAST 90 tablet 3  . Multiple Vitamins-Minerals (ONE-A-DAY WOMENS 50+ ADVANTAGE PO) Take 1 tablet by mouth daily.    . vitamin B-12 (CYANOCOBALAMIN) 1000 MCG tablet Take 1,000 mcg by mouth daily.    Marland Kitchen zolpidem (AMBIEN) 5 MG tablet TAKE 1 TABLET BY MOUTH AT BEDTIME IF NEEDED FOR SLEEP 90 tablet 1   No facility-administered medications prior to visit.    Allergies  Allergen Reactions  . Paroxetine Nausea Only and Other (See Comments)    Headache,weight loss, loss of appetite     ROS Review of Systems    Objective:    Physical Exam Constitutional:      Appearance: She is well-developed.  HENT:     Head: Normocephalic and atraumatic.  Cardiovascular:     Rate  and Rhythm: Normal rate and regular rhythm.     Heart sounds: Normal heart sounds.  Pulmonary:     Effort: Pulmonary effort is normal.     Breath sounds: Normal breath sounds.  Skin:    General: Skin is warm and dry.  Neurological:     Mental Status: She is alert and oriented to person, place, and time.  Psychiatric:        Behavior: Behavior normal.     BP (!) 138/55   Pulse (!) 54   Ht 4\' 10"  (1.473 m)   Wt 123 lb (55.8 kg)   SpO2 100%   BMI 25.71 kg/m  Wt Readings from Last 3 Encounters:  09/19/19 123 lb (55.8 kg)  09/04/19 124 lb (56.2 kg)  06/07/19 128 lb (58.1 kg)     Health Maintenance Due  Topic Date Due  . Hepatitis C Screening  Never done  . INFLUENZA VACCINE  09/16/2019    There are no preventive care reminders to display for this patient.  Lab Results  Component Value Date   TSH 0.77 09/04/2019   Lab Results  Component Value Date   WBC 6.8 06/07/2019   HGB 13.2 06/07/2019   HCT 41.7 06/07/2019   MCV 92.1 06/07/2019   PLT 360 06/07/2019   Lab Results  Component Value Date   NA 139 09/04/2019   K 4.4 09/04/2019   CO2 26 09/04/2019   GLUCOSE 93 09/04/2019   BUN 10 09/04/2019   CREATININE 0.83 09/04/2019   BILITOT 0.6 09/04/2019   ALKPHOS 68 04/21/2016   AST 17 09/04/2019   ALT 10 09/04/2019   PROT 7.8 09/04/2019   ALBUMIN 4.5 04/21/2016   CALCIUM 10.7 (H) 09/04/2019   Lab Results  Component Value Date   CHOL 149 09/04/2019   Lab Results  Component Value Date   HDL 51 09/04/2019   Lab Results  Component Value Date   LDLCALC 79 09/04/2019   Lab Results  Component Value Date   TRIG 107 09/04/2019   Lab Results  Component Value Date   CHOLHDL 2.9 09/04/2019   No results found for: HGBA1C    Assessment & Plan:   Problem List Items Addressed This Visit      Other   Shortness of breath - Primary    Performed spirometry today.  FVC of 72%, FEV1 of 72%, with ratio of 73%.  No significant improvement after albuterol.  There  was a 30% improvement in PEF.  Effort was  fair but not excellent.  She has never been a smoker.  We discussed that there is some evidence of mild restriction but otherwise normal spirometry no COPD or asthma.  Did discuss becoming more active to really improve her pulmonary function.  Like to repeat this in 1 year.       Other Visit Diagnoses    Elevated BP without diagnosis of hypertension          Elevated blood pressure-repeat looks fantastic today so for now we will just continue to keep an eye on it continue to work on low-salt diet and staying active.  Again discussed the importance of 30 minutes of moderate activity 5 days/week.  Meds ordered this encounter  Medications  . albuterol (VENTOLIN HFA) 108 (90 Base) MCG/ACT inhaler 4 puff    Follow-up: No follow-ups on file.    Nani Gasser, MD

## 2019-09-19 NOTE — Telephone Encounter (Signed)
Patient says that she needs prior authorization on her Ambien 5 mg nightly for her Medicare.  Mail order pharmacy, [facility] is awaiting approval.  Not sure who is covering prior office this week

## 2019-09-19 NOTE — Telephone Encounter (Signed)
No coverage this week. Will send to [facility] for when she does them next week.

## 2019-09-19 NOTE — Telephone Encounter (Signed)
Fleet Contras   Can you get any of the CMA's on their doctors half days or triage when you have 3 to help with these prior auths since front is unable to help this week? Just wondering so I am not overwhelmed with them next week.   Arline Asp

## 2019-09-21 ENCOUNTER — Other Ambulatory Visit: Payer: Self-pay

## 2019-09-21 DIAGNOSIS — G4709 Other insomnia: Secondary | ICD-10-CM

## 2019-09-21 MED ORDER — ZOLPIDEM TARTRATE 5 MG PO TABS: ORAL_TABLET | ORAL | 1 refills | Status: DC

## 2019-09-21 NOTE — Telephone Encounter (Signed)
Christine [name] is out of the Ambien. She would like it sent to [facility].

## 2019-09-26 NOTE — Addendum Note (Signed)
Addended by: Chalmers Cater on: 09/26/2019 08:45 AM   Modules accepted: Orders

## 2019-10-10 ENCOUNTER — Other Ambulatory Visit: Payer: Self-pay

## 2019-10-10 ENCOUNTER — Ambulatory Visit (INDEPENDENT_AMBULATORY_CARE_PROVIDER_SITE_OTHER): Payer: Medicare Other

## 2019-10-10 DIAGNOSIS — Z1231 Encounter for screening mammogram for malignant neoplasm of breast: Secondary | ICD-10-CM

## 2019-10-17 NOTE — Telephone Encounter (Signed)
Was this request completed?

## 2019-12-12 ENCOUNTER — Ambulatory Visit (INDEPENDENT_AMBULATORY_CARE_PROVIDER_SITE_OTHER): Payer: Medicare Other | Admitting: Family Medicine

## 2019-12-12 ENCOUNTER — Other Ambulatory Visit: Payer: Self-pay

## 2019-12-12 DIAGNOSIS — Z23 Encounter for immunization: Secondary | ICD-10-CM | POA: Diagnosis not present

## 2020-05-10 ENCOUNTER — Other Ambulatory Visit: Payer: Self-pay | Admitting: Family Medicine

## 2020-05-10 DIAGNOSIS — G4709 Other insomnia: Secondary | ICD-10-CM

## 2020-05-14 ENCOUNTER — Other Ambulatory Visit: Payer: Self-pay | Admitting: Family Medicine

## 2020-05-14 DIAGNOSIS — G4709 Other insomnia: Secondary | ICD-10-CM

## 2020-05-15 NOTE — Telephone Encounter (Signed)
Patient scheduled for 06/10/20. AM

## 2020-05-15 NOTE — Telephone Encounter (Signed)
Call pt for f/u on insomnia,thyroid and medication refills

## 2020-06-10 ENCOUNTER — Ambulatory Visit (INDEPENDENT_AMBULATORY_CARE_PROVIDER_SITE_OTHER): Payer: Medicare Other | Admitting: Family Medicine

## 2020-06-10 ENCOUNTER — Encounter: Payer: Self-pay | Admitting: Family Medicine

## 2020-06-10 VITALS — BP 145/50 | HR 79 | Ht <= 58 in | Wt 118.0 lb

## 2020-06-10 DIAGNOSIS — M810 Age-related osteoporosis without current pathological fracture: Secondary | ICD-10-CM | POA: Diagnosis not present

## 2020-06-10 DIAGNOSIS — G4709 Other insomnia: Secondary | ICD-10-CM

## 2020-06-10 DIAGNOSIS — F5101 Primary insomnia: Secondary | ICD-10-CM

## 2020-06-10 DIAGNOSIS — E039 Hypothyroidism, unspecified: Secondary | ICD-10-CM | POA: Diagnosis not present

## 2020-06-10 LAB — TSH: TSH: 0.65 mIU/L (ref 0.40–4.50)

## 2020-06-10 MED ORDER — PREDNISONE 20 MG PO TABS: 40.0000 mg | ORAL_TABLET | Freq: Every day | ORAL | 0 refills | Status: DC

## 2020-06-10 MED ORDER — ZOLPIDEM TARTRATE 5 MG PO TABS: ORAL_TABLET | ORAL | 1 refills | Status: DC

## 2020-06-10 MED ORDER — PREDNISONE 20 MG PO TABS
40.0000 mg | ORAL_TABLET | Freq: Every day | ORAL | 0 refills | Status: DC
Start: 2020-06-10 — End: 2020-06-10

## 2020-06-10 NOTE — Progress Notes (Signed)
Pt reports that the Boniva 150 mg is "Out of stock" and was told that she would need to request an alternative.

## 2020-06-10 NOTE — Assessment & Plan Note (Signed)
Due to recheck TSH. 

## 2020-06-10 NOTE — Progress Notes (Signed)
Established Patient Office Visit  Subjective:  Patient ID: Christine [name], female    DOB: July 21, 1940  Age: 80 y.o. MRN: [id number]  CC:  Chief Complaint  Patient presents with  . Hypothyroidism    HPI Christine [name] presents for   Hypothyroidism - Taking medication regularly in the AM away from food and vitamins, etc. No recent change to skin, hair, or energy levels.  F/U osteoporosis - Pt reports that the Boniva 150 mg is "Out of stock" and was told that she would need to request an alternative.  She says she is willing to wait and skip a month as long as they get it back and she had tried Fosamax in the past and did not do well with it.  F/U insomnia -she is doing well with her Ambien she feels like it is effective and denies any significant side effects of the medication.  She also reports that for about a week she has had a scratchy itchy throat, some shortness of breath.  She was outside visiting the grave site and was around a lot of weeds and pollen.  She has been taking her Zyrtec daily.  She says at one point her throat was extremely sore and she was using nausea inches.  She has had a slight cough from drainage as he is also been using NyQuil.  She did do a COVID test and it was negative.  Also reports that she just has no appetite.  She really does not want to cook anymore and just does not really feel the need to eat.  Past Medical History:  Diagnosis Date  . Depression   . Hyperlipidemia   . Hypertriglyceridemia   . Insomnia   . Osteopenia     Past Surgical History:  Procedure Laterality Date  . ABDOMINAL HYSTERECTOMY  1991  . COSMETIC SURGERY  1997   liposuction    Family History  Problem Relation Age of Onset  . Aneurysm Mother        abdominal  . Heart failure Mother   . Lung cancer Father        smoker    Social History   Socioeconomic History  . Marital status: Widowed    Spouse name: Molly Maduro  . Number of children: 2  . Years of education:  53  . Highest education level: Not on file  Occupational History  . Occupation: Still working    Employer: [employer]  Tobacco Use  . Smoking status: Never Smoker  . Smokeless tobacco: Never Used  Substance and Sexual Activity  . Alcohol use: No  . Drug use: No  . Sexual activity: Not on file  Other Topics Concern  . Not on file  Social History Narrative   1 caffeine drink per day.  No regular exercise.   Social Determinants of Health   Financial Resource Strain: Not on file  Food Insecurity: Not on file  Transportation Needs: Not on file  Physical Activity: Not on file  Stress: Not on file  Social Connections: Not on file  Intimate Partner Violence: Not on file    Outpatient Medications Prior to Visit  Medication Sig Dispense Refill  . atorvastatin (LIPITOR) 10 MG tablet TAKE 1 TABLET BY MOUTH  DAILY 90 tablet 3  . cetirizine (ZYRTEC) 10 MG tablet Take 10 mg by mouth daily.    . Cholecalciferol (VITAMIN D3) 50 MCG (2000 UT) capsule Take 2,000 Units by mouth daily.    Marland Kitchen esomeprazole (NEXIUM 24HR)  20 MG capsule Take 20 mg by mouth as needed.    . ibandronate (BONIVA) 150 MG tablet TAKE 1 TABLET EVERY MONTH IN THE MORNING WITH A FULL GLASS OF WATER ON AN EMPTY STOMACH 12 tablet 1  . levothyroxine (SYNTHROID) 50 MCG tablet TAKE 1 TABLET BY MOUTH  DAILY BEFORE BREAKFAST 90 tablet 3  . Multiple Vitamins-Minerals (ONE-A-DAY WOMENS 50+ ADVANTAGE PO) Take 1 tablet by mouth daily.    . vitamin B-12 (CYANOCOBALAMIN) 1000 MCG tablet Take 1,000 mcg by mouth daily.    Marland Kitchen zolpidem (AMBIEN) 5 MG tablet TAKE 1 TABLET BY MOUTH AT BEDTIME AS NEEDED FOR SLEEP 30 tablet 0   No facility-administered medications prior to visit.    Allergies  Allergen Reactions  . Fosamax [Alendronate] Other (See Comments)  . Paroxetine Nausea Only and Other (See Comments)    Headache,weight loss, loss of appetite     ROS Review of Systems    Objective:    Physical Exam Constitutional:       Appearance: She is well-developed.  HENT:     Head: Normocephalic and atraumatic.     Right Ear: Tympanic membrane, ear canal and external ear normal.     Left Ear: Tympanic membrane, ear canal and external ear normal.     Nose: Nose normal.     Mouth/Throat:     Mouth: Mucous membranes are moist.     Pharynx: Oropharynx is clear. No oropharyngeal exudate or posterior oropharyngeal erythema.  Eyes:     Conjunctiva/sclera: Conjunctivae normal.     Pupils: Pupils are equal, round, and reactive to light.  Neck:     Thyroid: No thyromegaly.  Cardiovascular:     Rate and Rhythm: Normal rate and regular rhythm.     Heart sounds: Normal heart sounds.  Pulmonary:     Effort: Pulmonary effort is normal.     Breath sounds: Normal breath sounds. No wheezing.  Musculoskeletal:     Cervical back: Neck supple.  Lymphadenopathy:     Cervical: No cervical adenopathy.  Skin:    General: Skin is warm and dry.  Neurological:     Mental Status: She is alert and oriented to person, place, and time.     BP (!) 145/50   Pulse 79   Ht 4\' 10"  (1.473 m)   Wt 118 lb (53.5 kg)   SpO2 100%   BMI 24.66 kg/m  Wt Readings from Last 3 Encounters:  06/10/20 118 lb (53.5 kg)  09/19/19 123 lb (55.8 kg)  09/04/19 124 lb (56.2 kg)     Health Maintenance Due  Topic Date Due  . Hepatitis C Screening  Never done  . COVID-19 Vaccine (3 - Booster for Pfizer series) 09/24/2019    There are no preventive care reminders to display for this patient.  Lab Results  Component Value Date   TSH 0.77 09/04/2019   Lab Results  Component Value Date   WBC 6.8 06/07/2019   HGB 13.2 06/07/2019   HCT 41.7 06/07/2019   MCV 92.1 06/07/2019   PLT 360 06/07/2019   Lab Results  Component Value Date   NA 139 09/04/2019   K 4.4 09/04/2019   CO2 26 09/04/2019   GLUCOSE 93 09/04/2019   BUN 10 09/04/2019   CREATININE 0.83 09/04/2019   BILITOT 0.6 09/04/2019   ALKPHOS 68 04/21/2016   AST 17 09/04/2019    ALT 10 09/04/2019   PROT 7.8 09/04/2019   ALBUMIN 4.5 04/21/2016   CALCIUM 10.7 (  H) 09/04/2019   Lab Results  Component Value Date   CHOL 149 09/04/2019   Lab Results  Component Value Date   HDL 51 09/04/2019   Lab Results  Component Value Date   LDLCALC 79 09/04/2019   Lab Results  Component Value Date   TRIG 107 09/04/2019   Lab Results  Component Value Date   CHOLHDL 2.9 09/04/2019   No results found for: HGBA1C    Assessment & Plan:   Problem List Items Addressed This Visit      Endocrine   Hypothyroidism - Primary    Due to recheck TSH.      Relevant Orders   TSH     Musculoskeletal and Integument   Osteoporosis    Says she prefers to stick with the Boniva so she would rather wait to see if they get it back in stock over the next month.  I think that is reasonable.  She did not tolerate the Fosamax well.        Other   Insomnia    Make sure that she has refills on her zolpidem.      Relevant Medications   zolpidem (AMBIEN) 5 MG tablet     Severe seasonal allergies-she is already taking Zyrtec daily.  We discussed a 5-day trial of prednisone.  If she is not significantly better then please give [place] call back early next week.  But I actually think this would provide some significant relief.  No sign of bacterial infection on exam today.  Meds ordered this encounter  Medications  . predniSONE (DELTASONE) 20 MG tablet    Sig: Take 2 tablets (40 mg total) by mouth daily with breakfast.    Dispense:  10 tablet    Refill:  0  . zolpidem (AMBIEN) 5 MG tablet    Sig: TAKE 1 TABLET BY MOUTH AT BEDTIME AS NEEDED FOR SLEEP    Dispense:  90 tablet    Refill:  1    Follow-up: Return in about 6 months (around 12/10/2020) for labs for cholesterol and recheck sleep and thyroid.    Nani Gasser, MD

## 2020-06-10 NOTE — Assessment & Plan Note (Signed)
Make sure that she has refills on her zolpidem.

## 2020-06-10 NOTE — Addendum Note (Signed)
Addended by: Deno Etienne on: 06/10/2020 09:56 AM   Modules accepted: Orders

## 2020-06-10 NOTE — Assessment & Plan Note (Signed)
Says she prefers to stick with the Sandrea Hammond so she would rather wait to see if they get it back in stock over the next month.  I think that is reasonable.  She did not tolerate the Fosamax well.

## 2020-06-11 ENCOUNTER — Other Ambulatory Visit: Payer: Self-pay | Admitting: *Deleted

## 2020-06-11 DIAGNOSIS — E039 Hypothyroidism, unspecified: Secondary | ICD-10-CM

## 2020-07-01 ENCOUNTER — Other Ambulatory Visit: Payer: Self-pay | Admitting: Family Medicine

## 2020-07-02 ENCOUNTER — Other Ambulatory Visit: Payer: Self-pay | Admitting: Family Medicine

## 2020-07-29 ENCOUNTER — Other Ambulatory Visit: Payer: Self-pay | Admitting: Family Medicine

## 2020-07-29 DIAGNOSIS — G4709 Other insomnia: Secondary | ICD-10-CM

## 2020-07-30 NOTE — Telephone Encounter (Signed)
Rx for this was sent 06/10/20 in Epic for 90 sent to [facility], PDMP states last filled Rx was 05/31/20 for 30, cancel [facility] and I can send 30 to [facility] as requested

## 2020-08-01 ENCOUNTER — Other Ambulatory Visit: Payer: Self-pay | Admitting: Family Medicine

## 2020-08-01 DIAGNOSIS — E039 Hypothyroidism, unspecified: Secondary | ICD-10-CM

## 2020-08-21 ENCOUNTER — Other Ambulatory Visit: Payer: Self-pay | Admitting: Family Medicine

## 2020-08-21 DIAGNOSIS — G4709 Other insomnia: Secondary | ICD-10-CM

## 2020-09-05 ENCOUNTER — Other Ambulatory Visit: Payer: Self-pay | Admitting: Family Medicine

## 2020-09-05 DIAGNOSIS — M810 Age-related osteoporosis without current pathological fracture: Secondary | ICD-10-CM

## 2020-10-25 ENCOUNTER — Telehealth: Payer: Self-pay | Admitting: Family Medicine

## 2020-10-25 NOTE — Telephone Encounter (Signed)
Left message for patient to call back and schedule Medicare Annual Wellness Visit (AWV) either virtually or in office.   Last AWV 06/07/17  please schedule at anytime with health coach

## 2020-11-26 ENCOUNTER — Encounter: Payer: Self-pay | Admitting: Family Medicine

## 2020-11-26 ENCOUNTER — Ambulatory Visit (INDEPENDENT_AMBULATORY_CARE_PROVIDER_SITE_OTHER): Payer: Medicare Other | Admitting: Family Medicine

## 2020-11-26 ENCOUNTER — Other Ambulatory Visit: Payer: Self-pay | Admitting: Family Medicine

## 2020-11-26 ENCOUNTER — Other Ambulatory Visit: Payer: Self-pay

## 2020-11-26 VITALS — BP 141/53 | HR 49 | Ht <= 58 in | Wt 117.0 lb

## 2020-11-26 DIAGNOSIS — R630 Anorexia: Secondary | ICD-10-CM

## 2020-11-26 DIAGNOSIS — E039 Hypothyroidism, unspecified: Secondary | ICD-10-CM

## 2020-11-26 DIAGNOSIS — G4709 Other insomnia: Secondary | ICD-10-CM

## 2020-11-26 DIAGNOSIS — Z0000 Encounter for general adult medical examination without abnormal findings: Secondary | ICD-10-CM | POA: Diagnosis not present

## 2020-11-26 DIAGNOSIS — Z23 Encounter for immunization: Secondary | ICD-10-CM | POA: Diagnosis not present

## 2020-11-26 DIAGNOSIS — Z1159 Encounter for screening for other viral diseases: Secondary | ICD-10-CM

## 2020-11-26 DIAGNOSIS — M810 Age-related osteoporosis without current pathological fracture: Secondary | ICD-10-CM

## 2020-11-26 DIAGNOSIS — Z1231 Encounter for screening mammogram for malignant neoplasm of breast: Secondary | ICD-10-CM

## 2020-11-26 DIAGNOSIS — Z780 Asymptomatic menopausal state: Secondary | ICD-10-CM

## 2020-11-26 MED ORDER — CYPROHEPTADINE HCL 4 MG PO TABS: 4.0000 mg | ORAL_TABLET | Freq: Three times a day (TID) | ORAL | 1 refills | Status: DC | PRN

## 2020-11-26 MED ORDER — ZOLPIDEM TARTRATE 5 MG PO TABS: 5.0000 mg | ORAL_TABLET | Freq: Every evening | ORAL | 1 refills | Status: DC | PRN

## 2020-11-26 NOTE — Assessment & Plan Note (Signed)
Prescription printed so that she could take it to a different pharmacy if needed.

## 2020-11-26 NOTE — Assessment & Plan Note (Signed)
He also wanted to discuss something to really help with her appetite she is just really struggling lately she feels like she is having to force herself to eat she has had times in her life before where this is happened.  She said she is always struggled a little bit with being underweight.  She said years ago she used to take Periactin and it was helpful.  She did not have any side effects or problems.  She wanted know if she could try that again she notices that when she does eat she has been eating junk food.  Go ahead and start the Cipro hepta Dean.  Monitor for side effects I like to see her back in about 2 to 3 months to make sure that is helpful, working, no side effects, and that it is helping her gain a little bit of weight.  Also discussed the importance of finding healthy snacking alternatives instead of junk food like chips etc. when she does eat.  I think this would help her feel better.

## 2020-11-26 NOTE — Assessment & Plan Note (Signed)
DEXA scan ordered today 

## 2020-11-26 NOTE — Progress Notes (Signed)
Subjective:     Christine [name] is a 80 y.o. female and is here for a comprehensive physical exam. The patient reports problems - ambien .  She has not been sleeping well because she has not been able to get her prior generic Ambien she says it was almost like a dark purple color.  She even switched pharmacies and the new pharmacy.  It for short period of time but then switched to the white tab generic and just does not seem to be nearly as effective.  Social History   Socioeconomic History   Marital status: Widowed    Spouse name: Molly Maduro   Number of children: 2   Years of education: 13   Highest education level: Not on file  Occupational History   Occupation: Still working    Employer: [employer]  Tobacco Use   Smoking status: Never   Smokeless tobacco: Never  Substance and Sexual Activity   Alcohol use: No   Drug use: No   Sexual activity: Not on file  Other Topics Concern   Not on file  Social History Narrative   1 caffeine drink per day.  No regular exercise.   Social Determinants of Health   Financial Resource Strain: Not on file  Food Insecurity: Not on file  Transportation Needs: Not on file  Physical Activity: Not on file  Stress: Not on file  Social Connections: Not on file  Intimate Partner Violence: Not on file   Health Maintenance  Topic Date Due   Hepatitis C Screening  Never done   Zoster Vaccines- Shingrix (1 of 2) Never done   COVID-19 Vaccine (3 - Booster for Pfizer series) 08/24/2019   TETANUS/TDAP  12/16/2025   INFLUENZA VACCINE  Completed   DEXA SCAN  Completed   HPV VACCINES  Aged Out    The following portions of the patient's history were reviewed and updated as appropriate: allergies, current medications, past family history, past medical history, past social history, past surgical history, and problem list.  Review of Systems Pertinent items are noted in HPI.   Objective:    BP (!) 141/53   Pulse (!) 49   Ht 4\' 10"  (1.473  m)   Wt 117 lb (53.1 kg)   SpO2 100%   BMI 24.45 kg/m  General appearance: alert, cooperative, and appears stated age Head: Normocephalic, without obvious abnormality, atraumatic Eyes:  conj clear, EOMI, PEERLA Ears: normal TM's and external ear canals both ears Nose: Nares normal. Septum midline. Mucosa normal. No drainage or sinus tenderness. Throat: lips, mucosa, and tongue normal; teeth and gums normal Neck: no adenopathy, no carotid bruit, no JVD, supple, symmetrical, trachea midline, and thyroid not enlarged, symmetric, no tenderness/mass/nodules Back: symmetric, no curvature. ROM normal. No CVA tenderness. Lungs: clear to auscultation bilaterally Heart: regular rate and rhythm, S1, S2 normal, no murmur, click, rub or gallop Abdomen: soft, non-tender; bowel sounds normal; no masses,  no organomegaly Pelvic: deferred Extremities: extremities normal, atraumatic, no cyanosis or edema Pulses: 2+ and symmetric Skin: Skin color, texture, turgor normal. No rashes or lesions Lymph nodes: Cervical adenopathy: nl and Supraclavicular adenopathy: nl Neurologic: Grossly normal    Assessment:    Healthy female exam.      Plan:     See After Visit Summary for Counseling Recommendations  Keep up a regular exercise program and make sure you are eating a healthy diet Try to eat 4 servings of dairy a day, or if you are lactose intolerant take  a calcium with vitamin D daily.  Your vaccines are up to date.  Due for updated labs today.  Encouraged her to call the her pharmacy to find out what generic she did do well on it she may want to call around to some other local pharmacies to see if they carry it.  It also comes in 3 other generics for the 5 mg so one of the other pharmacies might carry one of the other ones that might work just as well so she may want to check on that.  She is also get a go downstairs to schedule her mammogram and DEXA today. Flu vaccine given  today.   Insomnia Prescription printed so that she could take it to a different pharmacy if needed.  Hypothyroidism And to recheck TSH today.  Osteoporosis DEXA scan ordered today.  Decreased appetite He also wanted to discuss something to really help with her appetite she is just really struggling lately she feels like she is having to force herself to eat she has had times in her life before where this is happened.  She said she is always struggled a little bit with being underweight.  She said years ago she used to take Periactin and it was helpful.  She did not have any side effects or problems.  She wanted know if she could try that again she notices that when she does eat she has been eating junk food.  Go ahead and start the Cipro hepta Dean.  Monitor for side effects I like to see her back in about 2 to 3 months to make sure that is helpful, working, no side effects, and that it is helping her gain a little bit of weight.  Also discussed the importance of finding healthy snacking alternatives instead of junk food like chips etc. when she does eat.  I think this would help her feel better.

## 2020-11-26 NOTE — Assessment & Plan Note (Signed)
And to recheck TSH today.

## 2020-11-27 LAB — COMPLETE METABOLIC PANEL WITH GFR
AG Ratio: 1.7 (calc) (ref 1.0–2.5)
ALT: 11 U/L (ref 6–29)
AST: 19 U/L (ref 10–35)
Albumin: 4.8 g/dL (ref 3.6–5.1)
Alkaline phosphatase (APISO): 75 U/L (ref 37–153)
BUN: 12 mg/dL (ref 7–25)
CO2: 28 mmol/L (ref 20–32)
Calcium: 10.3 mg/dL (ref 8.6–10.4)
Chloride: 106 mmol/L (ref 98–110)
Creat: 0.79 mg/dL (ref 0.60–1.00)
Globulin: 2.9 g/dL (calc) (ref 1.9–3.7)
Glucose, Bld: 89 mg/dL (ref 65–99)
Potassium: 4.6 mmol/L (ref 3.5–5.3)
Sodium: 142 mmol/L (ref 135–146)
Total Bilirubin: 0.6 mg/dL (ref 0.2–1.2)
Total Protein: 7.7 g/dL (ref 6.1–8.1)
eGFR: 77 mL/min/{1.73_m2} (ref >=60)

## 2020-11-27 LAB — CBC
HCT: 42.3 % (ref 35.0–45.0)
Hemoglobin: 13.6 g/dL (ref 11.7–15.5)
MCH: 29.4 pg (ref 27.0–33.0)
MCHC: 32.2 g/dL (ref 32.0–36.0)
MCV: 91.6 fL (ref 80.0–100.0)
MPV: 11.2 fL (ref 7.5–12.5)
Platelets: 370 10*3/uL (ref 140–400)
RBC: 4.62 10*6/uL (ref 3.80–5.10)
RDW: 12.5 % (ref 11.0–15.0)
WBC: 6.5 10*3/uL (ref 3.8–10.8)

## 2020-11-27 LAB — HEPATITIS C ANTIBODY
Hepatitis C Ab: NONREACTIVE
SIGNAL TO CUT-OFF: 0.04 (ref <1.00)

## 2020-11-27 LAB — LIPID PANEL W/REFLEX DIRECT LDL
Cholesterol: 163 mg/dL (ref <200)
HDL: 66 mg/dL (ref >=50)
LDL Cholesterol (Calc): 82 mg/dL (calc)
Non-HDL Cholesterol (Calc): 97 mg/dL (calc) (ref <130)
Total CHOL/HDL Ratio: 2.5 (calc) (ref <5.0)
Triglycerides: 71 mg/dL (ref <150)

## 2020-11-27 LAB — TSH: TSH: 1.4 mIU/L (ref 0.40–4.50)

## 2020-11-28 NOTE — Progress Notes (Signed)
Your lab work is within acceptable range and there are no concerning findings.   ?

## 2020-12-10 ENCOUNTER — Ambulatory Visit (INDEPENDENT_AMBULATORY_CARE_PROVIDER_SITE_OTHER): Payer: Medicare Other

## 2020-12-10 ENCOUNTER — Ambulatory Visit: Payer: Medicare Other

## 2020-12-10 ENCOUNTER — Other Ambulatory Visit: Payer: Self-pay

## 2020-12-10 DIAGNOSIS — Z1231 Encounter for screening mammogram for malignant neoplasm of breast: Secondary | ICD-10-CM

## 2020-12-10 DIAGNOSIS — Z780 Asymptomatic menopausal state: Secondary | ICD-10-CM | POA: Diagnosis not present

## 2020-12-12 NOTE — Progress Notes (Signed)
Hi Tierney, your Bone density test shows a T score of -2.3 which show osteopenia, mildly think bones.     The current recommendation for osteopenia (mildly thin bones) treatment includes:   #1 calcium-total of 1200 mg of calcium daily.  If you eat a very calcium rich diet you may be able to obtain that without a supplement.  If not, then I recommend calcium 500 mg twice a day.  There are several products over-the-counter such as Caltrate D and Viactiv chews which are great options that contain calcium and vitamin D. #2 vitamin D-recommend 800 international units daily. #3 exercise-recommend 30 minutes of weightbearing exercise 3 days a week.  Resistance training ,such as doing bands and light weights, can be particularly helpful.

## 2020-12-12 NOTE — Progress Notes (Signed)
Please call patient. Normal mammogram.  Repeat in 1 year.  

## 2020-12-16 ENCOUNTER — Ambulatory Visit: Payer: Medicare Other | Admitting: Family Medicine

## 2021-03-03 ENCOUNTER — Encounter: Payer: Self-pay | Admitting: Family Medicine

## 2021-03-03 ENCOUNTER — Ambulatory Visit: Payer: Medicare Other | Admitting: Family Medicine

## 2021-03-03 ENCOUNTER — Other Ambulatory Visit: Payer: Self-pay

## 2021-03-03 VITALS — BP 136/65 | HR 72 | Resp 16 | Ht <= 58 in | Wt 117.0 lb

## 2021-03-03 DIAGNOSIS — R0602 Shortness of breath: Secondary | ICD-10-CM | POA: Diagnosis not present

## 2021-03-03 DIAGNOSIS — R630 Anorexia: Secondary | ICD-10-CM | POA: Diagnosis not present

## 2021-03-03 MED ORDER — PAROXETINE HCL 10 MG PO TABS: 10.0000 mg | ORAL_TABLET | Freq: Every day | ORAL | 0 refills | Status: DC

## 2021-03-03 NOTE — Patient Instructions (Signed)
If you can't take the paroxetine then please let me know and we can try something different.

## 2021-03-03 NOTE — Assessment & Plan Note (Signed)
Again discussed options.  Lets retry the Paxil.  We will just start with 10 mg dose and see if she tolerates it well she does report she has been struggling a little bit with some anxiety so I think it could be helpful with that in addition to really stimulating her appetite.  If not successful then we can consider some other alternatives.

## 2021-03-03 NOTE — Assessment & Plan Note (Signed)
Looking over her chart we had planned to repeat her spirometry over the summer.  Hopefully we can get her on the schedule this spring to repeat that.

## 2021-03-03 NOTE — Progress Notes (Signed)
Established Patient Office Visit  Subjective:  Patient ID: Christine [name], female    DOB: 07-31-1940  Age: 81 y.o. MRN: [id number]  CC:  Chief Complaint  Patient presents with   Follow up    Weight loss.     HPI Christine [name] presents for abnormal weight loss.  She did try taking the Periactin.  She had actually taken it years prior she said when she restarted it though she found it extremely sedating so she only took it once a day and she took it for about a week and then just stopped it.  The great news is that she actually has maintained her weight over the last couple of months.  She says she indulged a little bit more on her food choices over the holidays but still had to really force herself to eat.  She is open to trying something again and she says she is even open to trying the Paxil 1 more time it initially seemed to have caused some nausea so we had backed off of it.  But she is willing to give it a go 1 more time.  Past Medical History:  Diagnosis Date   Depression    Hyperlipidemia    Hypertriglyceridemia    Insomnia    Osteopenia     Past Surgical History:  Procedure Laterality Date   ABDOMINAL HYSTERECTOMY  1991   COSMETIC SURGERY  1997   liposuction    Family History  Problem Relation Age of Onset   Aneurysm Mother        abdominal   Heart failure Mother    Lung cancer Father        smoker    Social History   Socioeconomic History   Marital status: Widowed    Spouse name: Christine [name]   Number of children: 2   Years of education: 13   Highest education level: Not on file  Occupational History   Occupation: Still working    Employer: [employer]  Tobacco Use   Smoking status: Never   Smokeless tobacco: Never  Substance and Sexual Activity   Alcohol use: No   Drug use: No   Sexual activity: Not on file  Other Topics Concern   Not on file  Social History Narrative   1 caffeine drink per day.  No regular exercise.   Social  Determinants of Health   Financial Resource Strain: Not on file  Food Insecurity: Not on file  Transportation Needs: Not on file  Physical Activity: Not on file  Stress: Not on file  Social Connections: Not on file  Intimate Partner Violence: Not on file    Outpatient Medications Prior to Visit  Medication Sig Dispense Refill   atorvastatin (LIPITOR) 10 MG tablet TAKE 1 TABLET BY MOUTH  DAILY 90 tablet 3   cetirizine (ZYRTEC) 10 MG tablet Take 10 mg by mouth daily.     Cholecalciferol (VITAMIN D3) 50 MCG (2000 UT) capsule Take 2,000 Units by mouth daily.     cyproheptadine (PERIACTIN) 4 MG tablet Take 1 tablet (4 mg total) by mouth 3 (three) times daily as needed for allergies. 90 tablet 1   esomeprazole (NEXIUM) 20 MG capsule Take 20 mg by mouth as needed.     ibandronate (BONIVA) 150 MG tablet TAKE 1 TABLET BY MOUTH  MONTHLY WITH 8 OZ OF PLAIN  WATER 60 MINUTES BEFORE  FIRST FOOD, DRINK OR MEDS.  STAY UPRIGHT FOR 60 MINS 4 tablet 3  levothyroxine (SYNTHROID) 50 MCG tablet TAKE 1 TABLET BY MOUTH  DAILY BEFORE BREAKFAST 90 tablet 3   Multiple Vitamins-Minerals (ONE-A-DAY WOMENS 50+ ADVANTAGE PO) Take 1 tablet by mouth daily.     vitamin B-12 (CYANOCOBALAMIN) 1000 MCG tablet Take 1,000 mcg by mouth daily.     zolpidem (AMBIEN) 5 MG tablet Take 1 tablet (5 mg total) by mouth at bedtime as needed. for sleep 90 tablet 1   No facility-administered medications prior to visit.    Allergies  Allergen Reactions   Fosamax [Alendronate] Other (See Comments)   Paroxetine Nausea Only and Other (See Comments)    Headache,weight loss, loss of appetite     ROS Review of Systems    Objective:    Physical Exam Constitutional:      Appearance: Normal appearance. She is well-developed.  HENT:     Head: Normocephalic and atraumatic.  Cardiovascular:     Rate and Rhythm: Normal rate and regular rhythm.     Heart sounds: Normal heart sounds.  Pulmonary:     Effort: Pulmonary effort is  normal.     Breath sounds: Normal breath sounds.  Skin:    General: Skin is warm and dry.  Neurological:     Mental Status: She is alert and oriented to person, place, and time.  Psychiatric:        Behavior: Behavior normal.    BP 136/65    Pulse 72    Resp 16    Ht _0  (1.473 m)    Wt 117 lb (53.1 kg)    BMI 24.45 kg/m  Wt Readings from Last 3 Encounters:  03/03/21 117 lb (53.1 kg)  11/26/20 117 lb (53.1 kg)  06/10/20 118 lb (53.5 kg)     There are no preventive care reminders to display for this patient.  There are no preventive care reminders to display for this patient.  Lab Results  Component Value Date   TSH 1.40 11/26/2020   Lab Results  Component Value Date   WBC 6.5 11/26/2020   HGB 13.6 11/26/2020   HCT 42.3 11/26/2020   MCV 91.6 11/26/2020   PLT 370 11/26/2020   Lab Results  Component Value Date   NA 142 11/26/2020   K 4.6 11/26/2020   CO2 28 11/26/2020   GLUCOSE 89 11/26/2020   BUN 12 11/26/2020   CREATININE 0.79 11/26/2020   BILITOT 0.6 11/26/2020   ALKPHOS 68 04/21/2016   AST 19 11/26/2020   ALT 11 11/26/2020   PROT 7.7 11/26/2020   ALBUMIN 4.5 04/21/2016   CALCIUM 10.3 11/26/2020   EGFR 77 11/26/2020   Lab Results  Component Value Date   CHOL 163 11/26/2020   Lab Results  Component Value Date   HDL 66 11/26/2020   Lab Results  Component Value Date   LDLCALC 82 11/26/2020   Lab Results  Component Value Date   TRIG 71 11/26/2020   Lab Results  Component Value Date   CHOLHDL 2.5 11/26/2020   No results found for: HGBA1C    Assessment & Plan:   Problem List Items Addressed This Visit       Other   Shortness of breath    Looking over her chart we had planned to repeat her spirometry over the summer.  Hopefully we can get her on the schedule this spring to repeat that.      Decreased appetite - Primary    Again discussed options.  Lets retry the Paxil.  We  will just start with 10 mg dose and see if she tolerates it  well she does report she has been struggling a little bit with some anxiety so I think it could be helpful with that in addition to really stimulating her appetite.  If not successful then we can consider some other alternatives.      Relevant Medications   PARoxetine (PAXIL) 10 MG tablet    Meds ordered this encounter  Medications   PARoxetine (PAXIL) 10 MG tablet    Sig: Take 1 tablet (10 mg total) by mouth daily.    Dispense:  30 tablet    Refill:  0    Follow-up: Return in about 3 months (around 06/01/2021) for New start medication.    Beatrice Lecher, MD

## 2021-03-30 ENCOUNTER — Telehealth: Payer: Self-pay | Admitting: Family Medicine

## 2021-03-30 NOTE — Telephone Encounter (Signed)
Pt wants to get your opinion on signing up for a clinical trial with [facility]

## 2021-03-30 NOTE — Telephone Encounter (Signed)
I think with her family history that would be a great idea.  A lot of time still due for a blood work etc.

## 2021-03-30 NOTE — Telephone Encounter (Signed)
Patient advised.

## 2021-04-01 ENCOUNTER — Ambulatory Visit: Payer: Medicare Other | Admitting: Sports Medicine

## 2021-04-25 IMAGING — DX CHEST - 2 VIEW
2 series · 2 of 2 positions shown · non-contrast
Comparison: Chest x-ray dated 07/13/2012.

CLINICAL DATA: Worsening cough for 4 months, hyperlipidemia.

EXAM:
CHEST - 2 VIEW

[chest pa]
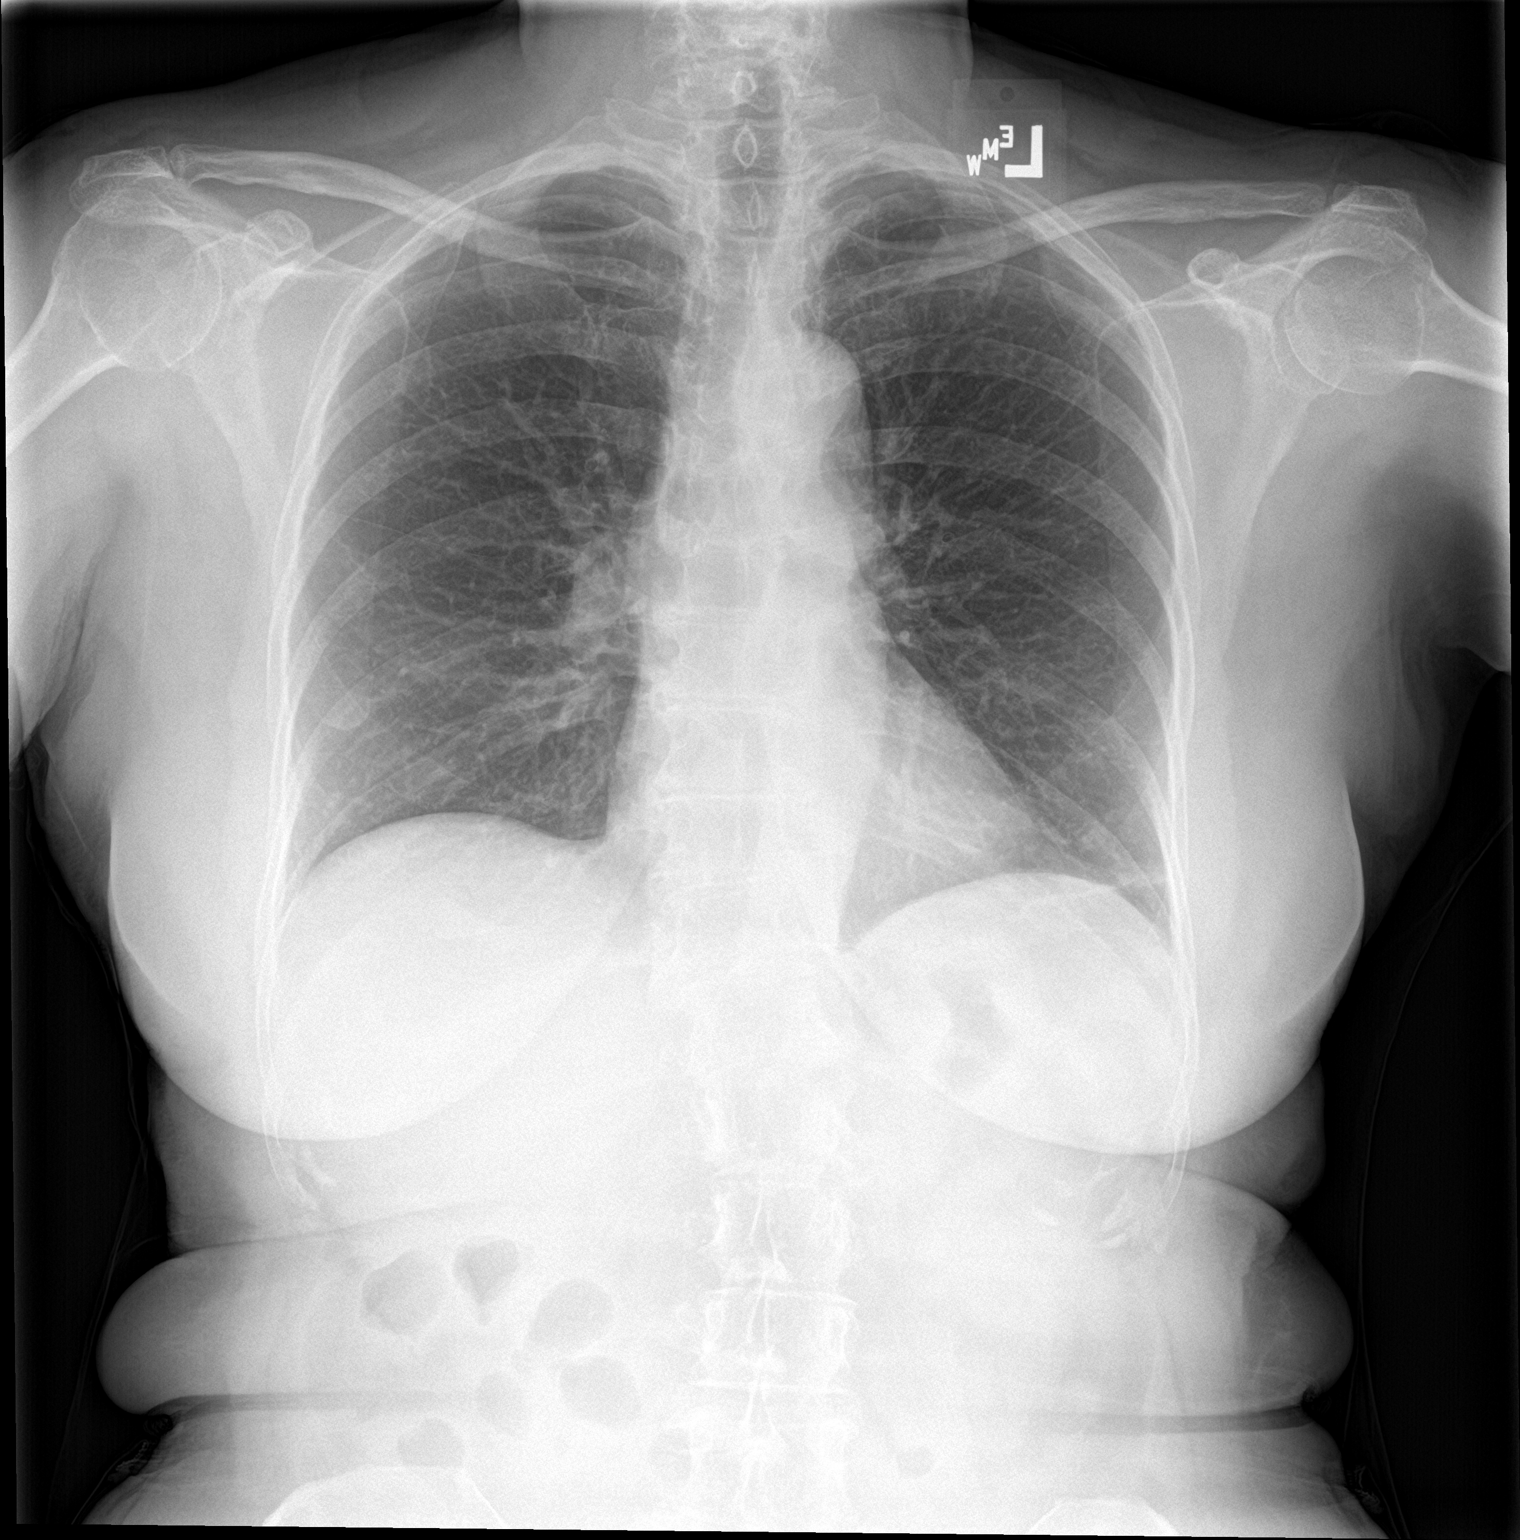

[chest lat]
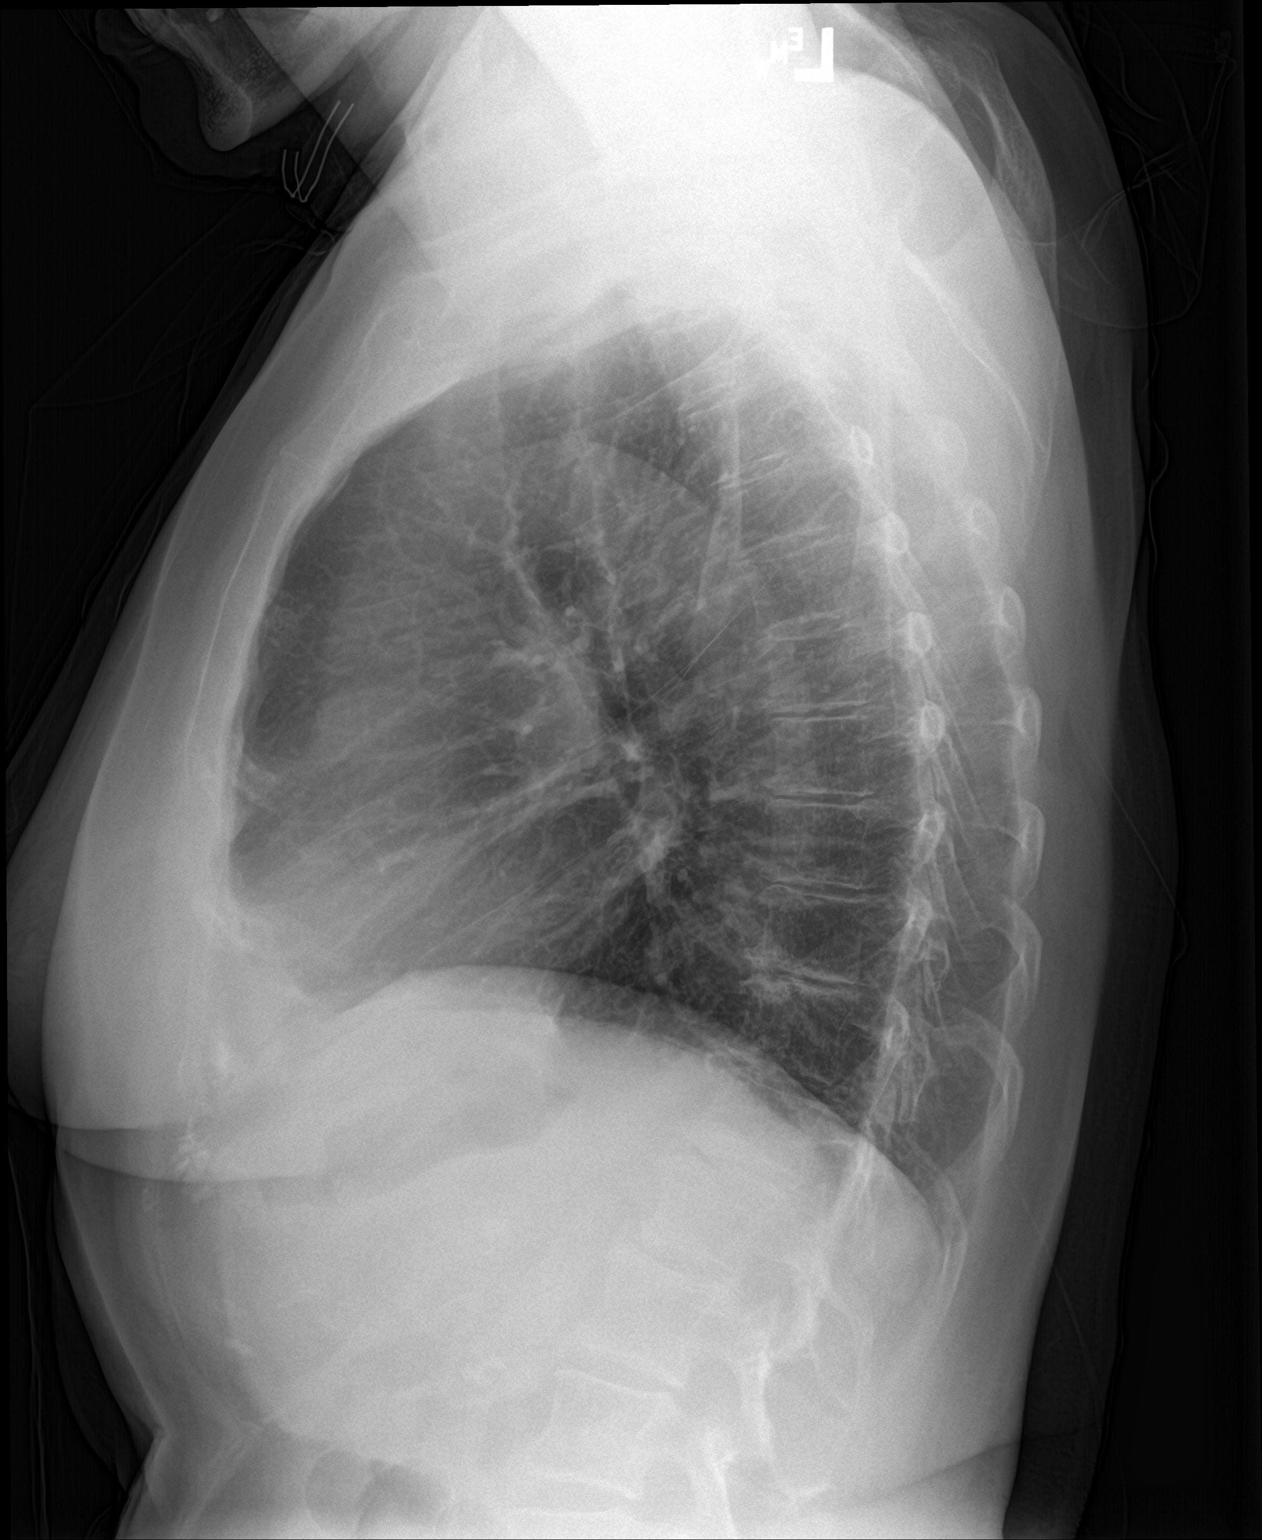

[2 of 2 positions shown; findings below may reference images not displayed]

FINDINGS: Heart size and mediastinal contours are within normal limits. Lungs
are clear. No pleural effusion or pneumothorax seen. Scoliosis of
the thoracolumbar spine, at least moderate in degree with associated
multilevel degenerative spondylosis. No acute appearing osseous
abnormality.
IMPRESSION: No active cardiopulmonary disease. No evidence of pneumonia or
pulmonary edema.

## 2021-05-21 ENCOUNTER — Other Ambulatory Visit: Payer: Self-pay | Admitting: Family Medicine

## 2021-05-21 DIAGNOSIS — G4709 Other insomnia: Secondary | ICD-10-CM

## 2021-05-21 DIAGNOSIS — R630 Anorexia: Secondary | ICD-10-CM

## 2021-07-24 ENCOUNTER — Telehealth: Payer: Self-pay | Admitting: *Deleted

## 2021-07-24 MED ORDER — DIAZEPAM 5 MG PO TABS: 5.0000 mg | ORAL_TABLET | Freq: Once | ORAL | 0 refills | Status: DC

## 2021-07-24 NOTE — Telephone Encounter (Signed)
Patient advised of message and verbalized understanding.  

## 2021-07-24 NOTE — Telephone Encounter (Signed)
Pt called and stated that she is taking part of the healthy brain study at [facility] and she is going for an MRI on next Friday. She stated that she cannot do closed spaces and was advised to contact her pcp to ask for something to help her so that she can be able to do the MRI. They suggested that she get a low dose of Valium to take prior to the procedure.   Allergies and pharmacy verified with patient.

## 2021-07-24 NOTE — Telephone Encounter (Signed)
Meds ordered this encounter  Medications   diazepam (VALIUM) 5 MG tablet    Sig: Take 1 tablet (5 mg total) by mouth once for 1 dose.    Dispense:  2 tablet    Refill:  0   Have her take it about 30 minutes before her procedure.  She will also have to make sure that she has someone to drive her since this can affect her ability to drive afterwards.

## 2021-08-01 LAB — LAB REPORT - SCANNED: A1c: 5.3

## 2021-08-06 ENCOUNTER — Other Ambulatory Visit: Payer: Self-pay | Admitting: Family Medicine

## 2021-08-06 DIAGNOSIS — E039 Hypothyroidism, unspecified: Secondary | ICD-10-CM

## 2021-08-06 NOTE — Telephone Encounter (Signed)
I have sent in a 90 day supply of Atorvastatin and Levothyroxine to [facility].

## 2021-08-06 NOTE — Telephone Encounter (Signed)
Patient has been scheduled for 08/27/21 with PCP and also states she will be out of Ambien before this appt and needs a refill sent to [facility]. AMUCK

## 2021-08-27 ENCOUNTER — Encounter: Payer: Self-pay | Admitting: Family Medicine

## 2021-08-27 ENCOUNTER — Ambulatory Visit: Payer: Medicare Other | Admitting: Family Medicine

## 2021-08-27 VITALS — BP 135/53 | HR 67 | Ht <= 58 in | Wt 110.0 lb

## 2021-08-27 DIAGNOSIS — E7800 Pure hypercholesterolemia, unspecified: Secondary | ICD-10-CM

## 2021-08-27 DIAGNOSIS — L299 Pruritus, unspecified: Secondary | ICD-10-CM

## 2021-08-27 DIAGNOSIS — R630 Anorexia: Secondary | ICD-10-CM

## 2021-08-27 DIAGNOSIS — E039 Hypothyroidism, unspecified: Secondary | ICD-10-CM | POA: Diagnosis not present

## 2021-08-27 DIAGNOSIS — F5101 Primary insomnia: Secondary | ICD-10-CM | POA: Diagnosis not present

## 2021-08-27 MED ORDER — LEVOTHYROXINE SODIUM 50 MCG PO TABS: 50.0000 ug | ORAL_TABLET | Freq: Every day | ORAL | 3 refills | Status: DC

## 2021-08-27 MED ORDER — ZOLPIDEM TARTRATE 5 MG PO TABS: 5.0000 mg | ORAL_TABLET | Freq: Every evening | ORAL | 1 refills | Status: DC | PRN

## 2021-08-27 MED ORDER — PAROXETINE HCL 10 MG PO TABS: 10.0000 mg | ORAL_TABLET | Freq: Every day | ORAL | 1 refills | Status: DC

## 2021-08-27 MED ORDER — ATORVASTATIN CALCIUM 10 MG PO TABS: 10.0000 mg | ORAL_TABLET | Freq: Every day | ORAL | 3 refills | Status: DC

## 2021-08-27 MED ORDER — DIAZEPAM 5 MG PO TABS: 5.0000 mg | ORAL_TABLET | Freq: Once | ORAL | 0 refills | Status: AC

## 2021-08-27 NOTE — Assessment & Plan Note (Signed)
Continue with low-dose Ambien and monitor for side effects.

## 2021-08-27 NOTE — Assessment & Plan Note (Signed)
She only tried the Paxil twice at 10 mg and felt like it made her a little drowsy so we discussed maybe using it at bedtime or in the evening and giving it another try.  If she is not able to tolerate it then please give [place] a call back.

## 2021-08-27 NOTE — Assessment & Plan Note (Signed)
Plan to recheck lipid levels.  Tolerating statin well.

## 2021-08-27 NOTE — Assessment & Plan Note (Signed)
Plan to recheck TSH today.  Can make adjustments if needed.

## 2021-08-27 NOTE — Progress Notes (Signed)
Established Patient Office Visit  Subjective   Patient ID: Christine [name], female    DOB: Oct 07, 1940  Age: 81 y.o. MRN: [id number]  Chief Complaint  Patient presents with   Hypothyroidism    HPI  Hypothyroidism - Taking medication regularly in the AM away from food and vitamins, etc. No recent change to skin, hair, or energy levels.  F/U insomnia - would like a refill on her AMbien.   Hyperlipidemia - tolerating stating well with no myalgias or significant side effects.  Lab Results  Component Value Date   CHOL 163 11/26/2020   HDL 66 11/26/2020   LDLCALC 82 11/26/2020   TRIG 71 11/26/2020   CHOLHDL 2.5 11/26/2020    Follow-up decrease in appetite.  When I last saw her she was really struggling with appetite.  So we had decided to start Paxil in hopes that would help with some low mood that she been experiencing in addition to appetite. Says she stopped it she said she actually ended up stopping the medication after couple days because it made her drowsy.  Feels like something is crawling on her. Has even taking out a magnifying glass to look and has not seen anything.  She does work out in the yard.  She says her skin does look a little bit dry under the magnifying glass but she has not noticed any rash or patches.  She does sometimes have seasonal allergies but that usually results more in runny nose etc.  No other recent changes in her medications.  She feels like the sensation is all over.  She has been under some increased stress recently.  She does need a refill on the diazepam.  We had sent over 2 tabs that she could take before her MRI.  She has 2 more imaging test coming up in the research study that she is involved in over at [facility].  It is a healthy brain study.  She is involved in part because she has had 2 siblings with dementia.  She would like for more tabs to have for her upcoming procedures she says they will not prescribe it there.    ROS     Objective:     BP (!) 135/53   Pulse 67   Ht 4\' 10"  (1.473 m)   Wt 110 lb (49.9 kg)   SpO2 100%   BMI 22.99 kg/m    Physical Exam Vitals and nursing note reviewed.  Constitutional:      Appearance: She is well-developed.  HENT:     Head: Normocephalic and atraumatic.  Cardiovascular:     Rate and Rhythm: Normal rate and regular rhythm.     Heart sounds: Normal heart sounds.  Pulmonary:     Effort: Pulmonary effort is normal.     Breath sounds: Normal breath sounds.  Skin:    General: Skin is warm and dry.     Findings: No rash.  Neurological:     Mental Status: She is alert and oriented to person, place, and time.  Psychiatric:        Behavior: Behavior normal.     No results found for any visits on 08/27/21.    The 10-year ASCVD risk score (Arnett DK, et al., 2019) is: 21.3%    Assessment & Plan:   Problem List Items Addressed This Visit       Endocrine   Hypothyroidism - Primary    Plan to recheck TSH today.  Can make adjustments if needed.  Relevant Medications   levothyroxine (SYNTHROID) 50 MCG tablet   Other Relevant Orders   TSH   COMPLETE METABOLIC PANEL WITH GFR   CBC with Differential/Platelet     Other   Insomnia    Continue with low-dose Ambien and monitor for side effects.      Relevant Medications   zolpidem (AMBIEN) 5 MG tablet   Hyperlipidemia    Plan to recheck lipid levels.  Tolerating statin well.      Relevant Medications   atorvastatin (LIPITOR) 10 MG tablet   Other Relevant Orders   TSH   COMPLETE METABOLIC PANEL WITH GFR   CBC with Differential/Platelet   Decreased appetite    She only tried the Paxil twice at 10 mg and felt like it made her a little drowsy so we discussed maybe using it at bedtime or in the evening and giving it another try.  If she is not able to tolerate it then please give [place] a call back.      Relevant Medications   PARoxetine (PAXIL) 10 MG tablet   Other Relevant Orders   TSH    COMPLETE METABOLIC PANEL WITH GFR   CBC with Differential/Platelet   Other Visit Diagnoses     Itching          Itching-unclear etiology.  The skin does not look particularly dry but it could be secondary to dry skin so using a good moisturizer after the shower could be helpful recommend a trial of either Eucerin or Cetaphil.  We will also check TSH to make sure that her thyroids not off.  We can check eosinophils to see if there may be something more allergic going on we will check liver enzymes.  Also discussed that it could be related to stress, especially since the sensation is all over. Consider checking lipase ( not abd sxs currently)  Did encourage her to get her shingles vaccine at the pharmacy.  It is now 100% covered by Medicare that as of this year.    Return in about 3 months (around 11/27/2021) for decreased appetitie.    Nani Gasser, MD

## 2021-08-27 NOTE — Patient Instructions (Signed)
Recommend using Eucerin cream or Cetaphil cream after shower to moisturize the skin well after patting dry.

## 2021-08-28 LAB — CBC WITH DIFFERENTIAL/PLATELET
Absolute Monocytes: 525 cells/uL (ref 200–950)
Basophils Absolute: 90 cells/uL (ref 0–200)
Basophils Relative: 1.4 %
Eosinophils Absolute: 128 cells/uL (ref 15–500)
Eosinophils Relative: 2 %
HCT: 40.7 % (ref 35.0–45.0)
Hemoglobin: 13.2 g/dL (ref 11.7–15.5)
Lymphs Abs: 2048 cells/uL (ref 850–3900)
MCH: 30.1 pg (ref 27.0–33.0)
MCHC: 32.4 g/dL (ref 32.0–36.0)
MCV: 92.9 fL (ref 80.0–100.0)
MPV: 10.8 fL (ref 7.5–12.5)
Monocytes Relative: 8.2 %
Neutro Abs: 3610 cells/uL (ref 1500–7800)
Neutrophils Relative %: 56.4 %
Platelets: 365 10*3/uL (ref 140–400)
RBC: 4.38 10*6/uL (ref 3.80–5.10)
RDW: 12.4 % (ref 11.0–15.0)
Total Lymphocyte: 32 %
WBC: 6.4 10*3/uL (ref 3.8–10.8)

## 2021-08-28 LAB — COMPLETE METABOLIC PANEL WITH GFR
AG Ratio: 1.8 (calc) (ref 1.0–2.5)
ALT: 13 U/L (ref 6–29)
AST: 19 U/L (ref 10–35)
Albumin: 4.9 g/dL (ref 3.6–5.1)
Alkaline phosphatase (APISO): 62 U/L (ref 37–153)
BUN: 12 mg/dL (ref 7–25)
CO2: 27 mmol/L (ref 20–32)
Calcium: 10.6 mg/dL — ABNORMAL HIGH (ref 8.6–10.4)
Chloride: 105 mmol/L (ref 98–110)
Creat: 0.89 mg/dL (ref 0.60–1.00)
Globulin: 2.7 g/dL (calc) (ref 1.9–3.7)
Glucose, Bld: 82 mg/dL (ref 65–99)
Potassium: 4.2 mmol/L (ref 3.5–5.3)
Sodium: 140 mmol/L (ref 135–146)
Total Bilirubin: 1 mg/dL (ref 0.2–1.2)
Total Protein: 7.6 g/dL (ref 6.1–8.1)
eGFR: 66 mL/min/{1.73_m2} (ref >=60)

## 2021-08-28 LAB — TSH: TSH: 0.38 mIU/L — ABNORMAL LOW (ref 0.40–4.50)

## 2021-08-28 NOTE — Progress Notes (Signed)
Hi Burnell, thyroid looks okay but we do need to make just a slight adjustment.  Please verify how you are taking the medication and we will make a varies minor change.  And then plan to recheck that in about 2 months.  Your blood count metabolic panel look good.  No sign of allergies.

## 2021-08-28 NOTE — Progress Notes (Signed)
Please see note.  I need her to verify her medications so working to have to call her.

## 2021-08-31 MED ORDER — LEVOTHYROXINE SODIUM 25 MCG PO TABS: 25.0000 ug | ORAL_TABLET | Freq: Every day | ORAL | 0 refills | Status: DC

## 2021-08-31 NOTE — Addendum Note (Signed)
Addended by: Nani Gasser D on: 08/31/2021 04:40 PM   Modules accepted: Orders

## 2021-08-31 NOTE — Progress Notes (Signed)
Okay, no problem.  I will send over a new prescription for the 25 mg tabs.  So I would like her to take the 25 mg on Monday Wednesday Friday, and the 50 mg the other 4 days a week.  Repeat this pattern weekly and then we will plan to recheck level in 2 months.

## 2021-09-02 ENCOUNTER — Ambulatory Visit: Payer: Medicare Other | Admitting: Family Medicine

## 2021-09-29 ENCOUNTER — Other Ambulatory Visit: Payer: Self-pay | Admitting: Family Medicine

## 2021-09-29 DIAGNOSIS — E039 Hypothyroidism, unspecified: Secondary | ICD-10-CM

## 2021-10-07 ENCOUNTER — Encounter: Payer: Self-pay | Admitting: General Practice

## 2021-11-05 ENCOUNTER — Other Ambulatory Visit: Payer: Self-pay | Admitting: Family Medicine

## 2021-11-05 DIAGNOSIS — E7800 Pure hypercholesterolemia, unspecified: Secondary | ICD-10-CM

## 2021-11-05 DIAGNOSIS — E039 Hypothyroidism, unspecified: Secondary | ICD-10-CM

## 2021-11-05 DIAGNOSIS — M810 Age-related osteoporosis without current pathological fracture: Secondary | ICD-10-CM

## 2022-01-21 ENCOUNTER — Other Ambulatory Visit: Payer: Self-pay | Admitting: Family Medicine

## 2022-02-14 ENCOUNTER — Telehealth: Payer: Self-pay | Admitting: Family Medicine

## 2022-02-16 ENCOUNTER — Telehealth: Payer: Self-pay | Admitting: Family Medicine

## 2022-02-16 DIAGNOSIS — F5101 Primary insomnia: Secondary | ICD-10-CM

## 2022-02-16 MED ORDER — ZOLPIDEM TARTRATE 5 MG PO TABS: 5.0000 mg | ORAL_TABLET | Freq: Every evening | ORAL | 0 refills | Status: DC | PRN

## 2022-02-16 MED ORDER — CYPROHEPTADINE HCL 4 MG PO TABS
4.0000 mg | ORAL_TABLET | Freq: Three times a day (TID) | ORAL | 0 refills | Status: DC | PRN
Start: 2022-02-16 — End: 2022-05-27

## 2022-02-16 NOTE — Telephone Encounter (Signed)
Called patient, no answer, or voicemail, will try again later, thanks.

## 2022-02-16 NOTE — Telephone Encounter (Signed)
Please call pt she will need a f/u appt.

## 2022-02-16 NOTE — Telephone Encounter (Signed)
Please call patient and remind her to follow-up next month.  Also please remind her that she is overdue to recheck her thyroid levels we had adjusted her dose back in July and she was supposed to return in September to have those rechecked.

## 2022-02-16 NOTE — Telephone Encounter (Signed)
Patient stated needs refill for ambien and Cyproheptadine 4mg   per pharmacy saying doctor needs to approve it no refills on that medication.

## 2022-02-24 NOTE — Telephone Encounter (Signed)
Patient informed and will keep apptointment schld for 03/11/22 @ 9:10

## 2022-03-02 NOTE — Telephone Encounter (Signed)
Please try calling pt again..

## 2022-03-11 ENCOUNTER — Ambulatory Visit: Payer: Medicare Other | Admitting: Family Medicine

## 2022-03-11 ENCOUNTER — Encounter: Payer: Self-pay | Admitting: Family Medicine

## 2022-03-11 VITALS — BP 141/60 | HR 64 | Ht <= 58 in | Wt 124.0 lb

## 2022-03-11 DIAGNOSIS — Z23 Encounter for immunization: Secondary | ICD-10-CM | POA: Diagnosis not present

## 2022-03-11 DIAGNOSIS — E7800 Pure hypercholesterolemia, unspecified: Secondary | ICD-10-CM

## 2022-03-11 DIAGNOSIS — M810 Age-related osteoporosis without current pathological fracture: Secondary | ICD-10-CM | POA: Diagnosis not present

## 2022-03-11 DIAGNOSIS — E039 Hypothyroidism, unspecified: Secondary | ICD-10-CM | POA: Diagnosis not present

## 2022-03-11 DIAGNOSIS — F5101 Primary insomnia: Secondary | ICD-10-CM

## 2022-03-11 DIAGNOSIS — F331 Major depressive disorder, recurrent, moderate: Secondary | ICD-10-CM

## 2022-03-11 DIAGNOSIS — E559 Vitamin D deficiency, unspecified: Secondary | ICD-10-CM

## 2022-03-11 NOTE — Patient Instructions (Signed)
Cut back on your B12 to once or twice a week.  Encourage you to work on some type of exercise whether it be stretching or strength training or walking.

## 2022-03-11 NOTE — Assessment & Plan Note (Signed)
Labs done this summer with research study. Will get labs abstracted. They look good.

## 2022-03-11 NOTE — Progress Notes (Signed)
Established Patient Office Visit  Subjective   Patient ID: Christine [name], female    DOB: 1940/05/27  Age: 82 y.o. MRN: [id number]  Chief Complaint  Patient presents with   Thyroid Problem   Insomnia    HPI  Hypothyroidism - Taking medication regularly in the AM away from food and vitamins, etc. No recent change to skin, hair, or energy levels.  F/U MDD -she just had one of her sisters passed away recently.  And her younger sister is starting to have symptoms of dementia.  F/U osteoporosis -she wonders if she could come off of the [place].  She has not been taking it for about the last 3 to 4 months.  F/U - insomnia -she is still struggling with sleep she is taking the 5 mg Ambien but feels like it is just not enough.  Occasionally she will take up Tylenol PM on top of it.  She wants to know if we can go up on her dose.  He did bring in some labs that were drawn as part of the research study that she is involved in for dementia.  Her TSH was a little low and her B12 was high.     ROS    Objective:     BP (!) 141/60 (BP Location: Left Arm, Patient Position: Sitting, Cuff Size: Small)   Pulse 64   Ht 4\' 10"  (1.473 m)   Wt 124 lb 0.6 oz (56.3 kg)   SpO2 100%   BMI 25.92 kg/m    Physical Exam Vitals and nursing note reviewed.  Constitutional:      Appearance: She is well-developed.  HENT:     Head: Normocephalic and atraumatic.  Cardiovascular:     Rate and Rhythm: Normal rate and regular rhythm.     Heart sounds: Normal heart sounds.  Pulmonary:     Effort: Pulmonary effort is normal.     Breath sounds: Normal breath sounds.  Skin:    General: Skin is warm and dry.  Neurological:     Mental Status: She is alert and oriented to person, place, and time.  Psychiatric:        Behavior: Behavior normal.     No results found for any visits on 03/11/22.    The 10-year ASCVD risk score (Arnett DK, et al., 2019) is: 22.3%    Assessment & Plan:   Problem  List Items Addressed This Visit       Endocrine   Hypothyroidism    Due to recheck TSH.  Last TSH was too low.        Relevant Orders   TSH   BASIC METABOLIC PANEL WITH GFR   Vitamin D (25 hydroxy)     Musculoskeletal and Integument   Osteoporosis    Will check Vit D and make sure thyroid is adequately replaced.       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 She doesn't feel comfortable changing at this time.       Hyperlipidemia - Primary    Labs done this summer with research study. Will get labs abstracted. They look good.  Relevant Orders   TSH   BASIC METABOLIC PANEL WITH GFR   Vitamin D (25 hydroxy)   Other Visit Diagnoses     Need for immunization against influenza       Relevant Orders   Flu Vaccine QUAD High Dose(Fluad) (Completed)   Flu Vaccine QUAD 60mo+IM (Fluarix, Fluzone & Alfiuria Quad PF) (Completed)       B12 was too high - recommend cut bck on dosing.    Return in about 6 months (around 09/09/2022) for thyroid and osteoporosis.    Beatrice Lecher, MD

## 2022-03-11 NOTE — Assessment & Plan Note (Signed)
Will check Vit D and make sure thyroid is adequately replaced.

## 2022-03-11 NOTE — Assessment & Plan Note (Signed)
Recheck level she is on a supplement

## 2022-03-11 NOTE — Assessment & Plan Note (Signed)
Due to recheck TSH.  Last TSH was too low.

## 2022-03-11 NOTE — Assessment & Plan Note (Signed)
Unable to go up on Ambien but we could change medications. She doesn't feel comfortable changing at this time.

## 2022-03-12 LAB — BASIC METABOLIC PANEL WITH GFR
BUN: 13 mg/dL (ref 7–25)
CO2: 29 mmol/L (ref 20–32)
Calcium: 10.5 mg/dL — ABNORMAL HIGH (ref 8.6–10.4)
Chloride: 104 mmol/L (ref 98–110)
Creat: 0.85 mg/dL (ref 0.60–1.00)
Glucose, Bld: 84 mg/dL (ref 65–99)
Potassium: 5 mmol/L (ref 3.5–5.3)
Sodium: 141 mmol/L (ref 135–146)
eGFR: 70 mL/min/{1.73_m2} (ref >=60)

## 2022-03-12 LAB — VITAMIN D 25 HYDROXY (VIT D DEFICIENCY, FRACTURES): Vit D, 25-Hydroxy: 33 ng/mL (ref 30–100)

## 2022-03-12 LAB — TSH: TSH: 0.85 mIU/L (ref 0.40–4.50)

## 2022-03-12 NOTE — Progress Notes (Signed)
Christine [name], overall your labs look great.  Your vitamin D is coming up nicely.

## 2022-05-19 ENCOUNTER — Telehealth: Payer: Self-pay | Admitting: Family Medicine

## 2022-05-19 NOTE — Telephone Encounter (Signed)
Called patient to schedule Medicare Annual Wellness Visit (AWV). Left message for patient to call back and schedule Medicare Annual Wellness Visit (AWV).  Last date of AWV: 06/07/2017  Please schedule an appointment at any time with [facility].  If any questions, please contact me at [phone].  Thank you ,  Trudi Ida

## 2022-05-26 ENCOUNTER — Other Ambulatory Visit: Payer: Self-pay | Admitting: Family Medicine

## 2022-05-26 DIAGNOSIS — F5101 Primary insomnia: Secondary | ICD-10-CM

## 2022-07-20 ENCOUNTER — Ambulatory Visit (INDEPENDENT_AMBULATORY_CARE_PROVIDER_SITE_OTHER): Payer: Medicare Other | Admitting: Family Medicine

## 2022-07-20 DIAGNOSIS — Z780 Asymptomatic menopausal state: Secondary | ICD-10-CM

## 2022-07-20 DIAGNOSIS — Z0000 Encounter for general adult medical examination without abnormal findings: Secondary | ICD-10-CM | POA: Diagnosis not present

## 2022-07-20 DIAGNOSIS — Z1231 Encounter for screening mammogram for malignant neoplasm of breast: Secondary | ICD-10-CM

## 2022-07-20 NOTE — Progress Notes (Signed)
MEDICARE ANNUAL WELLNESS VISIT  07/20/2022  Telephone Visit Disclaimer This Medicare AWV was conducted by telephone due to national recommendations for restrictions regarding the COVID-19 Pandemic (e.g. social distancing).  I verified, using two identifiers, that I am speaking with Christine [name] or their authorized healthcare agent. I discussed the limitations, risks, security, and privacy concerns of performing an evaluation and management service by telephone and the potential availability of an in-person appointment in the future. The patient expressed understanding and agreed to proceed.  Location of Patient: Home Location of Provider (nurse):  In the office.  Subjective:    Christine [name] is a 82 y.o. female patient of Metheney, Barbarann Ehlers, MD who had a Medicare Annual Wellness Visit today via telephone. [name] is Retired and lives alone. she has 2 children. she reports that she is socially active and does interact with friends/family regularly. she is moderately physically active and enjoys  browsing on her laptop, playing cards, puzzles and word search. She enjoys listening to music as well.   Patient Care Team: Agapito Games, MD as PCP - General     07/20/2022    8:19 AM 11/26/2020    8:48 AM 06/07/2017    2:11 PM 04/01/2015    8:47 AM  Advanced Directives  Does Patient Have a Medical Advance Directive? Yes Yes Yes Yes  Type of Advance Directive Living will;Healthcare Power of [employer] Power of [place];Living will Healthcare Power of Attorney Living will  Does patient want to make changes to medical advance directive? No - Patient declined     Copy of Healthcare Power of Attorney in Chart? No - copy requested Yes - validated most recent copy scanned in chart (See row information) No - copy requested No - copy requested    Hospital Utilization Over the Past 12 Months: # of hospitalizations or ER visits: 0 # of surgeries: 0  Review of Systems     Patient reports that her overall health is unchanged compared to last year.  History obtained from chart review and the patient  Patient Reported Readings (BP, Pulse, CBG, Weight, etc) none  Pain Assessment Pain : No/denies pain     Current Medications & Allergies (verified) Allergies as of 07/20/2022       Reactions   Fosamax [alendronate] Other (See Comments)   Paroxetine Nausea Only, Other (See Comments)   Headache,weight loss, loss of appetite         Medication List        Accurate as of July 20, 2022  8:33 AM. If you have any questions, ask your nurse or doctor.          atorvastatin 10 MG tablet Commonly known as: LIPITOR TAKE 1 TABLET BY MOUTH DAILY   cetirizine 10 MG tablet Commonly known as: ZYRTEC Take 10 mg by mouth daily.   cyanocobalamin 1000 MCG tablet Commonly known as: VITAMIN B12 Take 1,000 mcg by mouth daily.   cyproheptadine 4 MG tablet Commonly known as: PERIACTIN TAKE ONE TABLET BY MOUTH THREE TIMES A DAY AS NEEDED   esomeprazole 20 MG capsule Commonly known as: NEXIUM Take 20 mg by mouth as needed.   ibandronate 150 MG tablet Commonly known as: BONIVA TAKE 1 TABLET BY MOUTH  MONTHLY WITH 8 OZ OF PLAIN  WATER 60 MINUTES BEFORE  FIRST FOOD, DRINK OR MEDS.  STAY UPRIGHT FOR 60 MINS   levothyroxine 50 MCG tablet Commonly known as: SYNTHROID TAKE 1 TABLET BY MOUTH DAILY  BEFORE  BREAKFAST   ONE-A-DAY WOMENS 50+ ADVANTAGE PO Take 1 tablet by mouth daily.   Vitamin D3 50 MCG (2000 UT) capsule Take 2,000 Units by mouth daily.   zolpidem 5 MG tablet Commonly known as: AMBIEN TAKE ONE TABLET BY MOUTH AT BEDTIME AS NEEDED        History (reviewed): Past Medical History:  Diagnosis Date   Depression    Hyperlipidemia    Hypertriglyceridemia    Insomnia    Osteopenia    Past Surgical History:  Procedure Laterality Date   ABDOMINAL HYSTERECTOMY  1991   COSMETIC SURGERY  1997   liposuction   Family History  Problem  Relation Age of Onset   Aneurysm Mother        abdominal   Heart failure Mother    Lung cancer Father        smoker   Social History   Socioeconomic History   Marital status: Widowed    Spouse name: Molly Maduro   Number of children: 2   Years of education: 12   Highest education level: 12th grade  Occupational History   Occupation: Still working    Employer: [employer]   Occupation: Retired  Tobacco Use   Smoking status: Never   Smokeless tobacco: Never  Vaping Use   Vaping Use: Never used  Substance and Sexual Activity   Alcohol use: No   Drug use: No   Sexual activity: Not on file  Other Topics Concern   Not on file  Social History Narrative   Lives alone. She has two children and they live in [place]. She enjoys browsing on her laptop, playing cards, puzzles and word search. She enjoys listening to music as well.    Social Determinants of Health   Financial Resource Strain: Low Risk  (07/20/2022)   Overall Financial Resource Strain (CARDIA)    Difficulty of Paying Living Expenses: Not hard at all  Food Insecurity: No Food Insecurity (07/20/2022)   Hunger Vital Sign    Worried About Running Out of Food in the Last Year: Never true    Ran Out of Food in the Last Year: Never true  Transportation Needs: No Transportation Needs (07/20/2022)   PRAPARE - Administrator, Civil Service (Medical): No    Lack of Transportation (Non-Medical): No  Physical Activity: Insufficiently Active (07/20/2022)   Exercise Vital Sign    Days of Exercise per Week: 5 days    Minutes of Exercise per Session: 10 min  Stress: No Stress Concern Present (07/20/2022)   [employer] of Occupational Health - Occupational Stress Questionnaire    Feeling of Stress : Not at all  Social Connections: Moderately Isolated (07/20/2022)   Social Connection and Isolation Panel [NHANES]    Frequency of Communication with Friends and Family: More than three times a week    Frequency of  Social Gatherings with Friends and Family: Once a week    Attends Religious Services: More than 4 times per year    Active Member of [employer] or Organizations: No    Attends Banker Meetings: Never    Marital Status: Widowed    Activities of Daily Living    07/20/2022    8:24 AM  In your present state of health, do you have any difficulty performing the following activities:  Hearing? 0  Vision? 0  Difficulty concentrating or making decisions? 0  Walking or climbing stairs? 0  Dressing or bathing? 0  Doing errands, shopping? 0  Preparing Food and eating ? N  Using the Toilet? N  In the past six months, have you accidently leaked urine? N  Do you have problems with loss of bowel control? N  Managing your Medications? N  Managing your Finances? N  Housekeeping or managing your Housekeeping? N    Patient Education/ Literacy How often do you need to have someone help you when you read instructions, pamphlets, or other written materials from your doctor or pharmacy?: 1 - Never What is the last grade level you completed in school?: 12th grade  Exercise Current Exercise Habits: Home exercise routine, Type of exercise: Other - see comments (stationary bike), Time (Minutes): 10, Frequency (Times/Week): 5, Weekly Exercise (Minutes/Week): 50, Intensity: Moderate, Exercise limited by: None identified  Diet Patient reports consuming 2 meals a day and 1 snack(s) a day Patient reports that her primary diet is: Regular Patient reports that she does have regular access to food.   Depression Screen    07/20/2022    8:19 AM 03/11/2022    9:40 AM 03/03/2021    8:42 AM 11/26/2020    8:48 AM 09/04/2019    9:53 AM 08/24/2018    8:43 AM 12/26/2017    8:45 AM  PHQ 2/9 Scores  PHQ - 2 Score 0 0 0 0 0 1 0  PHQ- 9 Score  6    5      Fall Risk    07/20/2022    8:19 AM 03/11/2022    9:10 AM 03/03/2021    8:40 AM 11/26/2020    8:47 AM 08/24/2018    8:43 AM  Fall Risk   Falls in the past  year? 0 0 0 0 0  Number falls in past yr: 0 0 0 0 0  Injury with Fall? 0 0 0 0 0  Risk for fall due to : No Fall Risks No Fall Risks  No Fall Risks   Follow up Falls evaluation completed Falls evaluation completed Falls prevention discussed;Falls evaluation completed Falls prevention discussed;Falls evaluation completed      Objective:  Janielis Dolloff seemed alert and oriented and she participated appropriately during our telephone visit.  Blood Pressure Weight BMI  BP Readings from Last 3 Encounters:  03/11/22 (!) 141/60  08/27/21 (!) 135/53  03/03/21 136/65   Wt Readings from Last 3 Encounters:  03/11/22 124 lb 0.6 oz (56.3 kg)  08/27/21 110 lb (49.9 kg)  03/03/21 117 lb (53.1 kg)   BMI Readings from Last 1 Encounters:  03/11/22 25.92 kg/m    *Unable to obtain current vital signs, weight, and BMI due to telephone visit type  Hearing/Vision  Vedanshi did not seem to have difficulty with hearing/understanding during the telephone conversation Reports that she has not had a formal eye exam by an eye care professional within the past year Reports that she has not had a formal hearing evaluation within the past year *Unable to fully assess hearing and vision during telephone visit type  Cognitive Function:    07/20/2022    8:26 AM 09/04/2019    9:53 AM 08/24/2018    8:43 AM 06/07/2017    2:10 PM 07/22/2016    9:23 AM  6CIT Screen  What Year? 0 points 0 points 0 points 0 points 0 points  What month? 0 points 0 points 0 points 0 points 0 points  What time? 0 points 0 points 0 points 0 points 0 points  Count back from 20 0 points 0 points 0 points  0 points 0 points  Months in reverse 0 points 0 points 0 points 0 points 0 points  Repeat phrase 0 points 0 points 0 points 0 points 2 points  Total Score 0 points 0 points 0 points 0 points 2 points   (Normal:0-7, Significant for Dysfunction: >8)  Normal Cognitive Function Screening: Yes   Immunization & Health Maintenance  Record Immunization History  Administered Date(s) Administered   Fluad Quad(high Dose 65+) 12/08/2018, 12/12/2019, 11/26/2020, 03/11/2022   Influenza Split 11/19/2011   Influenza Whole 11/05/2008   Influenza, High Dose Seasonal PF 11/20/2014, 11/21/2014, 12/17/2016, 12/26/2017   Influenza,inj,Quad PF,6+ Mos 01/23/2013, 10/15/2013, 11/14/2015   PFIZER(Purple Top)SARS-COV-2 Vaccination 03/06/2019, 03/27/2019, 01/03/2020   Pfizer Covid-19 Vaccine Bivalent Booster 25yrs & up 05/04/2020   Pneumococcal Conjugate-13 09/25/2014   Pneumococcal Polysaccharide-23 02/15/2009   Td 01/04/2006   Tdap 12/17/2015    Health Maintenance  Topic Date Due   COVID-19 Vaccine (5 - 2023-24 season) 08/05/2022 (Originally 10/16/2021)   Zoster Vaccines- Shingrix (1 of 2) 11/28/2022 (Originally [birth year]17/1992)   INFLUENZA VACCINE  09/16/2022   Medicare Annual Wellness (AWV)  07/20/2023   DTaP/Tdap/Td (3 - Td or Tdap) 12/16/2025   Pneumonia Vaccine 3+ Years old  Completed   DEXA SCAN  Completed   HPV VACCINES  Aged Out   Hepatitis C Screening  Discontinued       Assessment  This is a routine wellness examination for [facility].  Health Maintenance: Due or Overdue There are no preventive care reminders to display for this patient.   Christine [name] does not need a referral for [employer] Assistance: Care Management:   no Social Work:    no Prescription Assistance:  no Nutrition/Diabetes Education:  no   Plan:  Personalized Goals  Goals Addressed               This Visit's Progress     Patient Stated (pt-stated)        07/20/2022 AWV Goal: Exercise for General Health  Patient will verbalize understanding of the benefits of increased physical activity: Exercising regularly is important. It will improve your overall fitness, flexibility, and endurance. Regular exercise also will improve your overall health. It can help you control your weight, reduce stress, and improve your bone density. Over  the next year, patient will increase physical activity as tolerated with a goal of at least 150 minutes of moderate physical activity per week.  You can tell that you are exercising at a moderate intensity if your heart starts beating faster and you start breathing faster but can still hold a conversation. Moderate-intensity exercise ideas include: Walking 1 mile (1.6 km) in about 15 minutes Biking Hiking Golfing Dancing Water aerobics Patient will verbalize understanding of everyday activities that increase physical activity by providing examples like the following: Yard work, such as: Insurance underwriter Gardening Washing windows or floors Patient will be able to explain general safety guidelines for exercising:  Before you start a new exercise program, talk with your health care provider. Do not exercise so much that you hurt yourself, feel dizzy, or get very short of breath. Wear comfortable clothes and wear shoes with good support. Drink plenty of water while you exercise to prevent dehydration or heat stroke. Work out until your breathing and your heartbeat get faster.        Personalized Health Maintenance & Screening Recommendations  Screening mammography Bone densitometry screening Shingles vaccine  Lung Cancer Screening Recommended: no (Low Dose CT Chest recommended if Age 63-80 years, 20 pack-year currently smoking OR have quit w/in past 15 years) Hepatitis C Screening recommended: no HIV Screening recommended: no  Advanced Directives: Written information was not prepared per patient's request.  Referrals & Orders Orders Placed This Encounter  Procedures   DEXAScan   Mammogram 3D SCREEN BREAST BILATERAL    Follow-up Plan Follow-up with Agapito Games, MD as planned Schedule shingles vaccine at the pharmacy. Medicare wellness visit in one year.  Patient will access AVS on  my chart.   I have personally reviewed and noted the following in the patient's chart:   Medical and social history Use of alcohol, tobacco or illicit drugs  Current medications and supplements Functional ability and status Nutritional status Physical activity Advanced directives List of other physicians Hospitalizations, surgeries, and ER visits in previous 12 months Vitals Screenings to include cognitive, depression, and falls Referrals and appointments  In addition, I have reviewed and discussed with Christine [name] certain preventive protocols, quality metrics, and best practice recommendations. A written personalized care plan for preventive services as well as general preventive health recommendations is available and can be mailed to the patient at her request.      Modesto Charon, RN BSN  07/20/2022

## 2022-07-20 NOTE — Patient Instructions (Addendum)
MEDICARE ANNUAL WELLNESS VISIT Health Maintenance Summary and Written Plan of Care  [name] ,  Thank you for allowing me to perform your Medicare Annual Wellness Visit and for your ongoing commitment to your health.   Health Maintenance & Immunization History Health Maintenance  Topic Date Due   COVID-19 Vaccine (5 - 2023-24 season) 08/05/2022 (Originally 10/16/2021)   Zoster Vaccines- Shingrix (1 of 2) 11/28/2022 (Originally 06/17/1992)   INFLUENZA VACCINE  09/16/2022   Medicare Annual Wellness (AWV)  07/20/2023   DTaP/Tdap/Td (3 - Td or Tdap) 12/16/2025   Pneumonia Vaccine 42+ Years old  Completed   DEXA SCAN  Completed   HPV VACCINES  Aged Out   Hepatitis C Screening  Discontinued   Immunization History  Administered Date(s) Administered   Fluad Quad(high Dose 65+) 12/08/2018, 12/12/2019, 11/26/2020, 03/11/2022   Influenza Split 11/19/2011   Influenza Whole 11/05/2008   Influenza, High Dose Seasonal PF 11/20/2014, 11/21/2014, 12/17/2016, 12/26/2017   Influenza,inj,Quad PF,6+ Mos 01/23/2013, 10/15/2013, 11/14/2015   PFIZER(Purple Top)SARS-COV-2 Vaccination 03/06/2019, 03/27/2019, 01/03/2020   Pfizer Covid-19 Vaccine Bivalent Booster 77yrs & up 05/04/2020   Pneumococcal Conjugate-13 09/25/2014   Pneumococcal Polysaccharide-23 02/15/2009   Td 01/04/2006   Tdap 12/17/2015    These are the patient goals that we discussed:  Goals Addressed               This Visit's Progress     Patient Stated (pt-stated)        07/20/2022 AWV Goal: Exercise for General Health  Patient will verbalize understanding of the benefits of increased physical activity: Exercising regularly is important. It will improve your overall fitness, flexibility, and endurance. Regular exercise also will improve your overall health. It can help you control your weight, reduce stress, and improve your bone density. Over the next year, patient will increase physical activity as tolerated with a goal of  at least 150 minutes of moderate physical activity per week.  You can tell that you are exercising at a moderate intensity if your heart starts beating faster and you start breathing faster but can still hold a conversation. Moderate-intensity exercise ideas include: Walking 1 mile (1.6 km) in about 15 minutes Biking Hiking Golfing Dancing Water aerobics Patient will verbalize understanding of everyday activities that increase physical activity by providing examples like the following: Yard work, such as: Insurance underwriter Gardening Washing windows or floors Patient will be able to explain general safety guidelines for exercising:  Before you start a new exercise program, talk with your health care provider. Do not exercise so much that you hurt yourself, feel dizzy, or get very short of breath. Wear comfortable clothes and wear shoes with good support. Drink plenty of water while you exercise to prevent dehydration or heat stroke. Work out until your breathing and your heartbeat get faster.          This is a list of Health Maintenance Items that are overdue or due now: Screening mammography Bone densitometry screening Shingles vaccine    Orders/Referrals Placed Today: Orders Placed This Encounter  Procedures   DEXAScan    Standing Status:   Future    Standing Expiration Date:   07/20/2023    Scheduling Instructions:     Please call patient to schedule. Patient due after 12/11/22.    Order Specific Question:   Reason for exam:    Answer:   post menopausal    Order  Specific Question:   Preferred imaging location?    Answer:   [facility]   Mammogram 3D SCREEN BREAST BILATERAL    Standing Status:   Future    Standing Expiration Date:   07/20/2023    Scheduling Instructions:     Please call patient to schedule.    Order Specific Question:   Reason for Exam (SYMPTOM  OR DIAGNOSIS  REQUIRED)    Answer:   breast cancer screening    Order Specific Question:   Preferred imaging location?    Answer:   [facility]   (Contact our referral department at [phone] if you have not spoken with someone about your referral appointment within the next 5 days)    Follow-up Plan Follow-up with Agapito Games, MD as planned Schedule shingles vaccine at the pharmacy. Medicare wellness visit in one year.  Patient will access AVS on my chart.      Health Maintenance, Female Adopting a healthy lifestyle and getting preventive care are important in promoting health and wellness. Ask your health care provider about: The right schedule for you to have regular tests and exams. Things you can do on your own to prevent diseases and keep yourself healthy. What should I know about diet, weight, and exercise? Eat a healthy diet  Eat a diet that includes plenty of vegetables, fruits, low-fat dairy products, and lean protein. Do not eat a lot of foods that are high in solid fats, added sugars, or sodium. Maintain a healthy weight Body mass index (BMI) is used to identify weight problems. It estimates body fat based on height and weight. Your health care provider can help determine your BMI and help you achieve or maintain a healthy weight. Get regular exercise Get regular exercise. This is one of the most important things you can do for your health. Most adults should: Exercise for at least 150 minutes each week. The exercise should increase your heart rate and make you sweat (moderate-intensity exercise). Do strengthening exercises at least twice a week. This is in addition to the moderate-intensity exercise. Spend less time sitting. Even light physical activity can be beneficial. Watch cholesterol and blood lipids Have your blood tested for lipids and cholesterol at 80 years of age, then have this test every 5 years. Have your cholesterol levels checked more often  if: Your lipid or cholesterol levels are high. You are older than 80 years of age. You are at high risk for heart disease. What should I know about cancer screening? Depending on your health history and family history, you may need to have cancer screening at various ages. This may include screening for: Breast cancer. Cervical cancer. Colorectal cancer. Skin cancer. Lung cancer. What should I know about heart disease, diabetes, and high blood pressure? Blood pressure and heart disease High blood pressure causes heart disease and increases the risk of stroke. This is more likely to develop in people who have high blood pressure readings or are overweight. Have your blood pressure checked: Every 3-5 years if you are 86-56 years of age. Every year if you are 68 years old or older. Diabetes Have regular diabetes screenings. This checks your fasting blood sugar level. Have the screening done: Once every three years after age 31 if you are at a normal weight and have a low risk for diabetes. More often and at a younger age if you are overweight or have a high risk for diabetes. What should I know about preventing infection? Hepatitis B If  you have a higher risk for hepatitis B, you should be screened for this virus. Talk with your health care provider to find out if you are at risk for hepatitis B infection. Hepatitis C Testing is recommended for: Everyone born from [birth year] through 1965. Anyone with known risk factors for hepatitis C. Sexually transmitted infections (STIs) Get screened for STIs, including gonorrhea and chlamydia, if: You are sexually active and are younger than 80 years of age. You are older than 80 years of age and your health care provider tells you that you are at risk for this type of infection. Your sexual activity has changed since you were last screened, and you are at increased risk for chlamydia or gonorrhea. Ask your health care provider if you are at risk. Ask  your health care provider about whether you are at high risk for HIV. Your health care provider may recommend a prescription medicine to help prevent HIV infection. If you choose to take medicine to prevent HIV, you should first get tested for HIV. You should then be tested every 3 months for as long as you are taking the medicine. Pregnancy If you are about to stop having your period (premenopausal) and you may become pregnant, seek counseling before you get pregnant. Take 400 to 800 micrograms (mcg) of folic acid every day if you become pregnant. Ask for birth control (contraception) if you want to prevent pregnancy. Osteoporosis and menopause Osteoporosis is a disease in which the bones lose minerals and strength with aging. This can result in bone fractures. If you are 73 years old or older, or if you are at risk for osteoporosis and fractures, ask your health care provider if you should: Be screened for bone loss. Take a calcium or vitamin D supplement to lower your risk of fractures. Be given hormone replacement therapy (HRT) to treat symptoms of menopause. Follow these instructions at home: Alcohol use Do not drink alcohol if: Your health care provider tells you not to drink. You are pregnant, may be pregnant, or are planning to become pregnant. If you drink alcohol: Limit how much you have to: 0-1 drink a day. Know how much alcohol is in your drink. In the [place] one drink equals one 12 oz bottle of beer (355 mL), one 5 oz glass of wine (148 mL), or one 1 oz glass of hard liquor (44 mL). Lifestyle Do not use any products that contain nicotine or tobacco. These products include cigarettes, chewing tobacco, and vaping devices, such as e-cigarettes. If you need help quitting, ask your health care provider. Do not use street drugs. Do not share needles. Ask your health care provider for help if you need support or information about quitting drugs. General instructions Schedule regular  health, dental, and eye exams. Stay current with your vaccines. Tell your health care provider if: You often feel depressed. You have ever been abused or do not feel safe at home. Summary Adopting a healthy lifestyle and getting preventive care are important in promoting health and wellness. Follow your health care provider's instructions about healthy diet, exercising, and getting tested or screened for diseases. Follow your health care provider's instructions on monitoring your cholesterol and blood pressure. This information is not intended to replace advice given to you by your health care provider. Make sure you discuss any questions you have with your health care provider. Document Revised: 06/23/2020 Document Reviewed: 06/23/2020 Elsevier Patient Education  2024 [employer].

## 2022-09-13 ENCOUNTER — Ambulatory Visit: Payer: Medicare Other | Admitting: Family Medicine

## 2022-09-13 ENCOUNTER — Encounter: Payer: Self-pay | Admitting: Family Medicine

## 2022-09-13 VITALS — BP 136/70 | HR 66 | Ht <= 58 in | Wt 122.0 lb

## 2022-09-13 DIAGNOSIS — M810 Age-related osteoporosis without current pathological fracture: Secondary | ICD-10-CM

## 2022-09-13 DIAGNOSIS — E7800 Pure hypercholesterolemia, unspecified: Secondary | ICD-10-CM

## 2022-09-13 DIAGNOSIS — F5101 Primary insomnia: Secondary | ICD-10-CM

## 2022-09-13 DIAGNOSIS — E559 Vitamin D deficiency, unspecified: Secondary | ICD-10-CM

## 2022-09-13 DIAGNOSIS — E039 Hypothyroidism, unspecified: Secondary | ICD-10-CM

## 2022-09-13 MED ORDER — IBANDRONATE SODIUM 150 MG PO TABS
ORAL_TABLET | ORAL | 3 refills | Status: DC
Start: 2022-09-13 — End: 2023-06-07

## 2022-09-13 MED ORDER — ZOLPIDEM TARTRATE 5 MG PO TABS: 2.5000 mg | ORAL_TABLET | Freq: Every evening | ORAL | 0 refills | Status: DC | PRN

## 2022-09-13 MED ORDER — CYPROHEPTADINE HCL 4 MG PO TABS: 4.0000 mg | ORAL_TABLET | Freq: Three times a day (TID) | ORAL | 1 refills | Status: DC | PRN

## 2022-09-13 MED ORDER — LEVOTHYROXINE SODIUM 25 MCG PO TABS: 25.0000 ug | ORAL_TABLET | ORAL | 1 refills | Status: DC

## 2022-09-13 NOTE — Patient Instructions (Signed)
Please come to the lab in 6 weeks for thyroid recheck. Take the 6 days per week and the one day per week.

## 2022-09-13 NOTE — Progress Notes (Addendum)
Established Patient Office Visit  Subjective   Patient ID: Christine [name], female    DOB: 03-Dec-1940  Age: 82 y.o. MRN: [id number]  Chief Complaint  Patient presents with   Hypothyroidism   Insomnia    Pt would like to discuss weaning off of Ambien.    HPI  Hypothyroidism - Taking medication regularly in the AM away from food and vitamins, etc. No recent change to skin, hair, or energy levels.  Hyperlipidemia - tolerating stating well with no myalgias or significant side effects.  Lab Results  Component Value Date   CHOL 163 11/26/2020   HDL 66 11/26/2020   LDLCALC 82 11/26/2020   TRIG 71 11/26/2020   CHOLHDL 2.5 11/26/2020    F/O osteoporosis.   Follow-up insomnia-she has been encouraged by the geriatrician who has been running the 2 research studies that she is involved in for dementia to discontinue/wean her Ambien.  She is currently on 5 mg and feels like she does really well on it they recommended that she consider doing a trial of magnesium gluconate to help with her sleep.  But she wanted to discuss it with me first.  She feels like personally she has noticed some slight changes in her short-term memory.  Did bring in a copy of some lab work that she had done in June as part of her research study including a lipid panel and an A1c so we will get those abstracted into her chart.   ROS    Objective:     BP 136/70   Pulse 66   Ht 4\' 10"  (1.473 m)   Wt 122 lb (55.3 kg)   SpO2 100%   BMI 25.50 kg/m    Physical Exam Vitals and nursing note reviewed.  Constitutional:      Appearance: She is well-developed.  HENT:     Head: Normocephalic and atraumatic.  Cardiovascular:     Rate and Rhythm: Normal rate and regular rhythm.     Heart sounds: Normal heart sounds.  Pulmonary:     Effort: Pulmonary effort is normal.     Breath sounds: Normal breath sounds.  Skin:    General: Skin is warm and dry.  Neurological:     Mental Status: She is alert and oriented to  person, place, and time.  Psychiatric:        Behavior: Behavior normal.      No results found for any visits on 09/13/22.    The ASCVD Risk score (Arnett DK, et al., 2019) failed to calculate for the following reasons:   The 2019 ASCVD risk score is only valid for ages 28 to 29    Assessment & Plan:   Problem List Items Addressed This Visit       Endocrine   Hypothyroidism    Take 50 mcg 6 days a week and 25 mcg 1 day a week.  Repeat cycle weekly and then plan to recheck level in 6 to 8 weeks.  Lab slip provided today.      Relevant Medications   levothyroxine (SYNTHROID) 25 MCG tablet   Other Relevant Orders   TSH     Musculoskeletal and Integument   Osteoporosis    For repeat bone density in October of this year.  Continue with Boniva and vitamin D supplementation.  Continue with adequate calcium intake in diet.      Relevant Medications   ibandronate (BONIVA) 150 MG tablet     Other   Vitamin D deficiency  New over-the-counter vitamin D supplement.         Insomnia    Discussed the potential risk of increased dementia with using chronic sleep aids like Ambien.  Encouraged her to try to work on continuing to decrease down to at least a half a tab or maybe even not taking every single night.  I do think taking the magnesium would be a reasonable option for her to try but I do think trying to wean down would be helpful for her.      Relevant Medications   zolpidem (AMBIEN) 5 MG tablet   Hyperlipidemia - Primary    Return in about 6 months (around 03/16/2023) for thyroidl.    Nani Gasser, MD

## 2022-09-13 NOTE — Assessment & Plan Note (Signed)
Take 50 mcg 6 days a week and 25 mcg 1 day a week.  Repeat cycle weekly and then plan to recheck level in 6 to 8 weeks.  Lab slip provided today.

## 2022-09-13 NOTE — Assessment & Plan Note (Signed)
New over-the-counter vitamin D supplement.

## 2022-09-13 NOTE — Assessment & Plan Note (Signed)
For repeat bone density in October of this year.  Continue with Boniva and vitamin D supplementation.  Continue with adequate calcium intake in diet.

## 2022-09-13 NOTE — Assessment & Plan Note (Signed)
Discussed the potential risk of increased dementia with using chronic sleep aids like Ambien.  Encouraged her to try to work on continuing to decrease down to at least a half a tab or maybe even not taking every single night.  I do think taking the magnesium would be a reasonable option for her to try but I do think trying to wean down would be helpful for her.

## 2022-11-24 ENCOUNTER — Other Ambulatory Visit: Payer: Self-pay | Admitting: Family Medicine

## 2022-11-24 DIAGNOSIS — E039 Hypothyroidism, unspecified: Secondary | ICD-10-CM

## 2022-11-29 ENCOUNTER — Telehealth: Payer: Self-pay | Admitting: Family Medicine

## 2022-11-29 NOTE — Telephone Encounter (Signed)
[employer] advised. Patient advised.

## 2022-11-29 NOTE — Telephone Encounter (Signed)
[employer] called in with a manufacturer change on the levothyroxine; Amneal to Lupin.  REF #: [id number] [phone]

## 2022-11-29 NOTE — Telephone Encounter (Signed)
OK that is fine. Will need to call pt and let her know will need a recheck of labs in 6 weeks. TSH

## 2022-12-13 ENCOUNTER — Other Ambulatory Visit: Payer: Self-pay | Admitting: Family Medicine

## 2022-12-13 DIAGNOSIS — F5101 Primary insomnia: Secondary | ICD-10-CM

## 2022-12-22 ENCOUNTER — Ambulatory Visit: Payer: Medicare Other

## 2022-12-22 DIAGNOSIS — Z1231 Encounter for screening mammogram for malignant neoplasm of breast: Secondary | ICD-10-CM | POA: Diagnosis not present

## 2022-12-22 DIAGNOSIS — Z780 Asymptomatic menopausal state: Secondary | ICD-10-CM | POA: Diagnosis not present

## 2022-12-22 DIAGNOSIS — Z0000 Encounter for general adult medical examination without abnormal findings: Secondary | ICD-10-CM

## 2022-12-25 ENCOUNTER — Other Ambulatory Visit: Payer: Self-pay | Admitting: Family Medicine

## 2022-12-25 DIAGNOSIS — E7800 Pure hypercholesterolemia, unspecified: Secondary | ICD-10-CM

## 2022-12-27 NOTE — Progress Notes (Signed)
Please call patient. Normal mammogram.  Repeat in 1 year.  

## 2023-01-20 NOTE — Progress Notes (Signed)
HI Dorothea,  Bone density test shows a T-score of -2.3 which is stable from 2 years ago which is fantastic.  With adequate calcium and vitamin D in addition to resistance exercise.  Since you have been on the [facility] for 5 years we usually take a break so it is okay to finish up your current supply and then stop it and then plan to recheck your bone density again in 2 years.

## 2023-02-24 LAB — TSH: TSH: 1.12 u[IU]/mL (ref 0.450–4.500)

## 2023-02-28 ENCOUNTER — Ambulatory Visit: Payer: Medicare Other | Admitting: Family Medicine

## 2023-03-09 ENCOUNTER — Encounter: Payer: Self-pay | Admitting: Family Medicine

## 2023-03-09 ENCOUNTER — Ambulatory Visit: Payer: Medicare Other | Admitting: Family Medicine

## 2023-03-09 VITALS — BP 134/63 | HR 92 | Ht <= 58 in | Wt 123.8 lb

## 2023-03-09 DIAGNOSIS — E039 Hypothyroidism, unspecified: Secondary | ICD-10-CM

## 2023-03-09 DIAGNOSIS — F331 Major depressive disorder, recurrent, moderate: Secondary | ICD-10-CM

## 2023-03-09 DIAGNOSIS — F5101 Primary insomnia: Secondary | ICD-10-CM

## 2023-03-09 DIAGNOSIS — E7800 Pure hypercholesterolemia, unspecified: Secondary | ICD-10-CM

## 2023-03-09 DIAGNOSIS — R5383 Other fatigue: Secondary | ICD-10-CM

## 2023-03-09 DIAGNOSIS — M810 Age-related osteoporosis without current pathological fracture: Secondary | ICD-10-CM

## 2023-03-09 DIAGNOSIS — E559 Vitamin D deficiency, unspecified: Secondary | ICD-10-CM

## 2023-03-09 DIAGNOSIS — Z818 Family history of other mental and behavioral disorders: Secondary | ICD-10-CM

## 2023-03-09 DIAGNOSIS — G2581 Restless legs syndrome: Secondary | ICD-10-CM

## 2023-03-09 MED ORDER — ZOLPIDEM TARTRATE 5 MG PO TABS: 2.5000 mg | ORAL_TABLET | Freq: Every evening | ORAL | 1 refills | Status: DC | PRN

## 2023-03-09 MED ORDER — CYPROHEPTADINE HCL 4 MG PO TABS: 4.0000 mg | ORAL_TABLET | Freq: Three times a day (TID) | ORAL | 1 refills | Status: DC | PRN

## 2023-03-09 MED ORDER — ROPINIROLE HCL 0.25 MG PO TABS: 0.2500 mg | ORAL_TABLET | Freq: Every day | ORAL | 1 refills | Status: AC

## 2023-03-09 MED ORDER — SERTRALINE HCL 50 MG PO TABS: ORAL_TABLET | ORAL | 1 refills | Status: DC

## 2023-03-09 NOTE — Assessment & Plan Note (Signed)
She is really been struggling a little bit more lately.  She just found out that her baby brother has brain cancer, stage IV he just had surgery but they were unable to get all of the tumor.  She is just trying to be supportive and spend a lot of time with him.  But she is also just feeling her own mortality.  She has been on Zoloft in the past and says she would like to restart the medication.

## 2023-03-09 NOTE — Assessment & Plan Note (Signed)
Recent TSH looks fantastic doing well on current regimen we will clean up her medication list.

## 2023-03-09 NOTE — Assessment & Plan Note (Signed)
Last DEXA UTD and stable from 2022.  Continue Boniva and Vit D.

## 2023-03-09 NOTE — Progress Notes (Signed)
Established Patient Office Visit  Subjective  Patient ID: Christine [name], female    DOB: 1940/06/28  Age: 83 y.o. MRN: [id number]  Chief Complaint  Patient presents with   Hypothyroidism   Depression    Patient requesting to start Zoloft    restless leg syndrome    Patient requesting to restart medicaiton for RLS-     HPI She would like to discuss her mood she has been feeling more down more recently and would like to restart Zoloft which she had taken in the past.  Hyperlipidemia - tolerating stating well with no myalgias or significant side effects.  Lab Results  Component Value Date   CHOL 163 11/26/2020   HDL 66 11/26/2020   LDLCALC 82 11/26/2020   TRIG 71 11/26/2020   CHOLHDL 2.5 11/26/2020   Restless leg syndrome-she would like to restart her medication for restless leg.    ROS    Objective:     BP 134/63   Pulse 92   Ht 4\' 10"  (1.473 m)   Wt 123 lb 12 oz (56.1 kg)   SpO2 100%   BMI 25.86 kg/m    Physical Exam Vitals and nursing note reviewed.  Constitutional:      Appearance: Normal appearance.  HENT:     Head: Normocephalic and atraumatic.  Eyes:     Conjunctiva/sclera: Conjunctivae normal.  Cardiovascular:     Rate and Rhythm: Normal rate and regular rhythm.  Pulmonary:     Effort: Pulmonary effort is normal.     Breath sounds: Normal breath sounds.  Skin:    General: Skin is warm and dry.  Neurological:     Mental Status: She is alert.  Psychiatric:        Mood and Affect: Mood normal.      No results found for any visits on 03/09/23.    The ASCVD Risk score (Arnett DK, et al., 2019) failed to calculate for the following reasons:   The 2019 ASCVD risk score is only valid for ages 36 to 36    Assessment & Plan:   Problem List Items Addressed This Visit       Endocrine   Hypothyroidism - Primary   Recent TSH looks fantastic doing well on current regimen we will clean up her medication list.      Relevant Orders    CMP14+EGFR   Lipid panel   CBC   Fe+TIBC+Fer     Musculoskeletal and Integument   Osteoporosis   Last DEXA UTD and stable from 2022.  Continue Boniva and Vit D.         Other   Vitamin D deficiency   Still taking her vitamin D and boniva.       Relevant Orders   VITAMIN D 25 Hydroxy (Vit-D Deficiency, Fractures)   RLS (restless legs syndrome)   Her restless leg at night has really been more disruptive to her sleep quality and she is interested in trying medication again.  Will also check for iron deficiency.      Relevant Medications   rOPINIRole (REQUIP) 0.25 MG tablet   Other Relevant Orders   CMP14+EGFR   Lipid panel   CBC   Fe+TIBC+Fer   Recurrent major depression (HCC)   She is really been struggling a little bit more lately.  She just found out that her baby brother has brain cancer, stage IV he just had surgery but they were unable to get all of the tumor.  She is  just trying to be supportive and spend a lot of time with him.  But she is also just feeling her own mortality.  She has been on Zoloft in the past and says she would like to restart the medication.      Relevant Medications   sertraline (ZOLOFT) 50 MG tablet   Other Relevant Orders   B12   Folate   Insomnia   She did get a smart watch and has been tracking her sleep she says she does not seem to stay in deep sleep very long.  Her restless leg has also been making a little bit more difficult for her to fall asleep she would like a refill on her Ambien.      Relevant Medications   zolpidem (AMBIEN) 5 MG tablet   Hyperlipidemia   Due to recheck lipid panel.      Family history of dementia   She is currently enrolled in a study and is being followed for cognitive issues.  This spring will make 2 years that she has been enrolled and so far has been steady but she is a little nervous about the testing coming up in May.  I believe she will have some repeat imaging done as well and they should send [place] those  results afterwards.      Other Visit Diagnoses       Other fatigue       Relevant Orders   CMP14+EGFR   Lipid panel   CBC   Fe+TIBC+Fer   B12   Folate      FAtigue -Will do some additional labs today just to evaluate for deficiency, thyroid abnormality.  Will recheck iron levels since she has been having increased restless leg symptoms.  Return in about 2 months (around 05/07/2023) for Mood/RLS.    Nani Gasser, MD

## 2023-03-09 NOTE — Assessment & Plan Note (Signed)
She did get a smart watch and has been tracking her sleep she says she does not seem to stay in deep sleep very long.  Her restless leg has also been making a little bit more difficult for her to fall asleep she would like a refill on her Ambien.

## 2023-03-09 NOTE — Assessment & Plan Note (Signed)
Still taking her vitamin D and boniva.

## 2023-03-09 NOTE — Assessment & Plan Note (Signed)
Due to recheck lipid panel.

## 2023-03-09 NOTE — Assessment & Plan Note (Signed)
Her restless leg at night has really been more disruptive to her sleep quality and she is interested in trying medication again.  Will also check for iron deficiency.

## 2023-03-09 NOTE — Assessment & Plan Note (Signed)
She is currently enrolled in a study and is being followed for cognitive issues.  This spring will make 2 years that she has been enrolled and so far has been steady but she is a little nervous about the testing coming up in May.  I believe she will have some repeat imaging done as well and they should send [place] those results afterwards.

## 2023-03-10 LAB — CMP14+EGFR
ALT: 16 [IU]/L (ref 0–32)
AST: 24 [IU]/L (ref 0–40)
Albumin: 4.7 g/dL (ref 3.8–4.8)
Alkaline Phosphatase: 88 [IU]/L (ref 44–121)
BUN/Creatinine Ratio: 16 (ref 12–28)
BUN: 14 mg/dL (ref 8–27)
Bilirubin Total: 0.4 mg/dL (ref 0.0–1.2)
CO2: 21 mmol/L (ref 20–29)
Calcium: 10.1 mg/dL (ref 8.7–10.3)
Chloride: 104 mmol/L (ref 96–106)
Creatinine, Ser: 0.86 mg/dL (ref 0.57–1.00)
Globulin, Total: 2.4 g/dL (ref 1.5–4.5)
Glucose: 90 mg/dL (ref 70–99)
Potassium: 5 mmol/L (ref 3.5–5.2)
Sodium: 140 mmol/L (ref 134–144)
Total Protein: 7.1 g/dL (ref 6.0–8.5)
eGFR: 68 mL/min/{1.73_m2} (ref >59)

## 2023-03-10 LAB — LIPID PANEL
Chol/HDL Ratio: 2.7 {ratio} (ref 0.0–4.4)
Cholesterol, Total: 162 mg/dL (ref 100–199)
HDL: 60 mg/dL (ref >39)
LDL Chol Calc (NIH): 89 mg/dL (ref 0–99)
Triglycerides: 67 mg/dL (ref 0–149)
VLDL Cholesterol Cal: 13 mg/dL (ref 5–40)

## 2023-03-10 LAB — CBC
Hematocrit: 40.3 % (ref 34.0–46.6)
Hemoglobin: 13.1 g/dL (ref 11.1–15.9)
MCH: 29.5 pg (ref 26.6–33.0)
MCHC: 32.5 g/dL (ref 31.5–35.7)
MCV: 91 fL (ref 79–97)
Platelets: 343 10*3/uL (ref 150–450)
RBC: 4.44 x10E6/uL (ref 3.77–5.28)
RDW: 12.7 % (ref 11.7–15.4)
WBC: 6.3 10*3/uL (ref 3.4–10.8)

## 2023-03-10 LAB — VITAMIN B12: Vitamin B-12: 686 pg/mL (ref 232–1245)

## 2023-03-10 LAB — IRON,TIBC AND FERRITIN PANEL
Ferritin: 168 ng/mL — ABNORMAL HIGH (ref 15–150)
Iron Saturation: 21 % (ref 15–55)
Iron: 71 ug/dL (ref 27–139)
Total Iron Binding Capacity: 335 ug/dL (ref 250–450)
UIBC: 264 ug/dL (ref 118–369)

## 2023-03-10 LAB — VITAMIN D 25 HYDROXY (VIT D DEFICIENCY, FRACTURES): Vit D, 25-Hydroxy: 36.1 ng/mL (ref 30.0–100.0)

## 2023-03-10 LAB — FOLATE: Folate: >20 ng/mL (ref >3.0)

## 2023-03-11 ENCOUNTER — Encounter: Payer: Self-pay | Admitting: Family Medicine

## 2023-03-11 NOTE — Progress Notes (Signed)
Hi Kerensa, iron ferritin stores look good.  So that is definitely not contributing to your restless legs so that is good.  Hopefully the repair and I will send over is starting to help we can always adjust the dose a little bit if you think weaning to go a little stronger with it after couple weeks just let me know.  Your metabolic panel and cholesterol look great.  Blood counts normal no sign of anemia.  Vitamin D looks better than it did last time but still little on the low end so definitely keep taking your vitamin D.  B12 and folate look fantastic.

## 2023-03-16 ENCOUNTER — Ambulatory Visit: Payer: Medicare Other | Admitting: Family Medicine

## 2023-05-10 ENCOUNTER — Other Ambulatory Visit: Payer: Self-pay | Admitting: Family Medicine

## 2023-05-10 DIAGNOSIS — F331 Major depressive disorder, recurrent, moderate: Secondary | ICD-10-CM

## 2023-06-07 ENCOUNTER — Ambulatory Visit: Payer: Medicare Other | Admitting: Family Medicine

## 2023-06-07 ENCOUNTER — Encounter: Payer: Self-pay | Admitting: Family Medicine

## 2023-06-07 VITALS — BP 134/62 | HR 60 | Ht <= 58 in | Wt 128.0 lb

## 2023-06-07 DIAGNOSIS — F331 Major depressive disorder, recurrent, moderate: Secondary | ICD-10-CM | POA: Diagnosis not present

## 2023-06-07 DIAGNOSIS — G2581 Restless legs syndrome: Secondary | ICD-10-CM

## 2023-06-07 DIAGNOSIS — H9193 Unspecified hearing loss, bilateral: Secondary | ICD-10-CM | POA: Diagnosis not present

## 2023-06-07 DIAGNOSIS — H9190 Unspecified hearing loss, unspecified ear: Secondary | ICD-10-CM | POA: Insufficient documentation

## 2023-06-07 DIAGNOSIS — M810 Age-related osteoporosis without current pathological fracture: Secondary | ICD-10-CM

## 2023-06-07 MED ORDER — SERTRALINE HCL 25 MG PO TABS: 25.0000 mg | ORAL_TABLET | Freq: Every day | ORAL | 1 refills | Status: DC

## 2023-06-07 NOTE — Assessment & Plan Note (Addendum)
 Significant improvement after starting sertraline .  Significant reduction in PHQ-9 score.  She did go up to 50 mg in the sertraline  but felt like she was doing fine on 25 so she actually went back down so she has been splitting the 50s.  Will send over an updated prescription for 25 mg instead.

## 2023-06-07 NOTE — Assessment & Plan Note (Signed)
 For to audiology for hearing testing.

## 2023-06-07 NOTE — Assessment & Plan Note (Signed)
 Stopped her ibandronate  in 2025 as she had been on it for 8 years.

## 2023-06-07 NOTE — Assessment & Plan Note (Signed)
 She is using the Mirapex 1 tab intermittently.  She said she never did try 2 tabs.  Rule out iron deficiency.

## 2023-06-07 NOTE — Progress Notes (Signed)
 Established Patient Office Visit  Subjective  Patient ID: Christine [name], female    DOB: August 22, 1940  Age: 83 y.o. MRN: [id number]  Chief Complaint  Patient presents with   mood   rls    HPI  Here for follow-up mood.  We recently restarted her sertraline  in January she had taken it years prior.  She started at 25 mg and then tapered up to 50 but after a few weeks wanted to go back down to the 25 she felt comfortable staying on that dose and felt like it was helping.  So she has been splitting the 50 mg tabs.  So here for follow-up restless leg-she was having an increase in symptoms we did rule out iron deficiency in January.  Started ropinirole  1-2 tabs at night. She has been taking it occasionally..    Would like to have hearing testing done as well family members have noted that she is listening to her TV and music really loud to the point that when they walk in the house they are having to ask her to turn it down.  She has noticed that she has been turning the volume up on things but feels like she can still hear conversations okay.  She also notes that occasionally if she is sitting or laying down she will just feel a sudden intake of breath that is involuntary.  Is just a single breath and never happens when she is active she does ride a stationary bike daily in the morning she has not had any recent shortness of breath cough or chest discomfort.  But did want to mention it.    ROS    Objective:     BP 134/62   Pulse 60   Ht 4\' 10"  (1.473 m)   Wt 128 lb (58.1 kg)   SpO2 (!) 9%   BMI 26.75 kg/m    Physical Exam Vitals and nursing note reviewed.  Constitutional:      Appearance: Normal appearance.  HENT:     Head: Normocephalic and atraumatic.     Right Ear: Tympanic membrane, ear canal and external ear normal.     Left Ear: Tympanic membrane, ear canal and external ear normal.  Eyes:     Conjunctiva/sclera: Conjunctivae normal.  Cardiovascular:     Rate and  Rhythm: Normal rate and regular rhythm.  Pulmonary:     Effort: Pulmonary effort is normal.     Breath sounds: Normal breath sounds.  Skin:    General: Skin is warm and dry.  Neurological:     Mental Status: She is alert.  Psychiatric:        Mood and Affect: Mood normal.      No results found for any visits on 06/07/23.    The ASCVD Risk score (Arnett DK, et al., 2019) failed to calculate for the following reasons:   The 2019 ASCVD risk score is only valid for ages 21 to 39    Assessment & Plan:   Problem List Items Addressed This Visit       Nervous and Auditory   Hearing loss   For to audiology for hearing testing.      Relevant Orders   Ambulatory referral to Audiology     Musculoskeletal and Integument   Osteoporosis   Stopped her ibandronate  in 2025 as she had been on it for 8 years.        Other   RLS (restless legs syndrome) - Primary   She  is using the Mirapex 1 tab intermittently.  She said she never did try 2 tabs.  Rule out iron deficiency.      Recurrent major depression (HCC)   Significant improvement after starting sertraline .  Significant reduction in PHQ-9 score.  She did go up to 50 mg in the sertraline  but felt like she was doing fine on 25 so she actually went back down so she has been splitting the 50s.  Will send over an updated prescription for 25 mg instead.      Relevant Medications   sertraline  (ZOLOFT ) 25 MG tablet   Suspect she is probably just doing some shallow breathing with sitting or lying down and then her chest is involuntarily taking a deeper breath and she is probably noticing it.  Return in about 4 months (around 10/07/2023) for thyroid and mood.    Duaine German, MD

## 2023-09-30 ENCOUNTER — Other Ambulatory Visit: Payer: Self-pay | Admitting: Family Medicine

## 2023-09-30 DIAGNOSIS — F5101 Primary insomnia: Secondary | ICD-10-CM

## 2023-10-05 ENCOUNTER — Ambulatory Visit: Admitting: Family Medicine

## 2023-10-05 ENCOUNTER — Encounter: Payer: Self-pay | Admitting: Family Medicine

## 2023-10-05 VITALS — BP 153/68 | HR 65 | Ht <= 58 in | Wt 129.0 lb

## 2023-10-05 DIAGNOSIS — E039 Hypothyroidism, unspecified: Secondary | ICD-10-CM

## 2023-10-05 DIAGNOSIS — E559 Vitamin D deficiency, unspecified: Secondary | ICD-10-CM

## 2023-10-05 DIAGNOSIS — L819 Disorder of pigmentation, unspecified: Secondary | ICD-10-CM | POA: Insufficient documentation

## 2023-10-05 DIAGNOSIS — F331 Major depressive disorder, recurrent, moderate: Secondary | ICD-10-CM

## 2023-10-05 DIAGNOSIS — F5101 Primary insomnia: Secondary | ICD-10-CM

## 2023-10-05 MED ORDER — TRI-LUMA 0.01-4-0.05 % EX CREA: 1.0000 | TOPICAL_CREAM | Freq: Every evening | CUTANEOUS | 5 refills | Status: AC

## 2023-10-05 MED ORDER — SERTRALINE HCL 25 MG PO TABS: 25.0000 mg | ORAL_TABLET | Freq: Every day | ORAL | 1 refills | Status: AC

## 2023-10-05 NOTE — Assessment & Plan Note (Signed)
 Doing well and happy with current regimen of sertraline  25mg . Doesn't want to taper at this time.

## 2023-10-05 NOTE — Progress Notes (Signed)
 Established Patient Office Visit  Subjective  Patient ID: Christine [name], female    DOB: 05/14/1940  Age: 83 y.o. MRN: [id number]  Chief Complaint  Patient presents with   Hypothyroidism    HPI Discussed the use of AI scribe software for clinical note transcription with the patient, who gave verbal consent to proceed.  History of Present Illness Christine [name] is an 83 year old female who presents for a routine follow-up as part of a brain health study.  Cognitive function and brain health - Participating in a brain health study at [facility] involving regular cognitive testing and MRIs - Recently underwent cognitive testing and MRI - Scheduled for amyloid scan every 2 years   Gait disturbance - Difficulty with walking - Requested handicap permit for mobility assistance - Has a stationary bike at home for physical activity  Sleep disturbance - Uses zolpidem  for sleep - Zolpidem  is effective without causing morning drowsiness  Appetite and weight maintenance - Uses periactin  to assist with appetite and weight maintenance - Current weight is 125 pounds  Mood symptoms - On sertraline  25 mg, happy with regimen.  Didn't tolerate 50mg    Thyroid function and dermatologic findings - No recent changes in skin or hair related to thyroid condition - Skin pigmentation changes attributed to family history  Also c/o of pigmentation on facial cheeks neaer her eyes.  Says sibling have a s well. Would like Derm ref or cream to help.      ROS    Objective:     BP (!) 153/68   Pulse 65   Ht 4' 10 (1.473 m)   Wt 129 lb (58.5 kg)   SpO2 100%   BMI 26.96 kg/m    Physical Exam Vitals and nursing note reviewed.  Constitutional:      Appearance: Normal appearance.  HENT:     Head: Normocephalic and atraumatic.  Eyes:     Conjunctiva/sclera: Conjunctivae normal.  Cardiovascular:     Rate and Rhythm: Normal rate and regular rhythm.  Pulmonary:     Effort: Pulmonary  effort is normal.     Breath sounds: Normal breath sounds.  Skin:    General: Skin is warm and dry.  Neurological:     Mental Status: She is alert.  Psychiatric:        Mood and Affect: Mood normal.      No results found for any visits on 10/05/23.    The ASCVD Risk score (Arnett DK, et al., 2019) failed to calculate for the following reasons:   The 2019 ASCVD risk score is only valid for ages 77 to 15    Assessment & Plan:   Problem List Items Addressed This Visit       Endocrine   Hypothyroidism - Primary   Relevant Orders   TSH     Musculoskeletal and Integument   Hyperpigmentation of skin of cheek   Recommend sunscreen and will see if insurance will cover Triluma. If not covered can do low dose steroid or retinoid.       Relevant Medications   Fluocin-Hydroquinone-Tretinoin (TRI-LUMA ) 0.01-4-0.05 % CREA     Other   Vitamin D  deficiency   Recheck levels.       Relevant Orders   VITAMIN D  25 Hydroxy (Vit-D Deficiency, Fractures)   Recurrent major depression (HCC)   Doing well and happy with current regimen of sertraline  25mg . Doesn't want to taper at this time.       Relevant Medications  sertraline  (ZOLOFT ) 25 MG tablet   Insomnia   Still does well with ambien  nightly      Assessment and Plan Assessment & Plan Hypothyroidism No recent changes in medication or symptoms. Skin pigmentation likely due to sun exposure or familial tendency. - Order blood work to check thyroid levels.  Skin hyperpigmentation Increased pigmentation likely due to familial tendency or sun exposure. Discussed treatment options and informed about limited efficacy of topical creams. - Prescribe topical cream for skin lightening. - Consider dermatology consultation if topical treatment is ineffective.  Insomnia Continues zolpidem  with satisfactory results. - Refill zolpidem  prescription.  Major depressive disorder, single episode, unspecified Maintained sertraline  25 mg  dosage. - Continue sertraline  25 mg.  Discussed getting shingles vaccine at [facility].  tri  Return in about 6 months (around 04/06/2024) for thyroid .    Christine Byars, MD

## 2023-10-05 NOTE — Assessment & Plan Note (Signed)
 Recheck levels

## 2023-10-05 NOTE — Assessment & Plan Note (Addendum)
 Recommend sunscreen and will see if insurance will cover Triluma. If not covered can do low dose steroid or retinoid.

## 2023-10-05 NOTE — Assessment & Plan Note (Signed)
 Still does well with ambien  nightly

## 2023-10-06 ENCOUNTER — Ambulatory Visit: Payer: Self-pay | Admitting: Family Medicine

## 2023-10-06 ENCOUNTER — Encounter: Payer: Self-pay | Admitting: Family Medicine

## 2023-10-06 LAB — VITAMIN D 25 HYDROXY (VIT D DEFICIENCY, FRACTURES): Vit D, 25-Hydroxy: 33.7 ng/mL (ref 30.0–100.0)

## 2023-10-06 LAB — TSH: TSH: 0.391 u[IU]/mL — ABNORMAL LOW (ref 0.450–4.500)

## 2023-10-06 NOTE — Progress Notes (Signed)
 Hi Tarhonda, TSH is a little on the low side.  Can you verify how you are taking her medication and get a need to make a slight adjustment.  And then we will plan to recheck your level again in 6 to 8 weeks.  Your vitamin D  is similar to last time but still on the lower end of normal so please continue with your vitamin D  supplementation.  Please try to get your shingles vaccine done at the local pharmacy as well.

## 2023-10-07 NOTE — Telephone Encounter (Signed)
 She is technically overmedicated based on her number.  Have her skip Sundays and take 1 tab daily 6 days pwer week. Repeat this pattern and then recheck in 6 weeks

## 2023-10-18 ENCOUNTER — Encounter: Payer: Self-pay | Admitting: Sports Medicine

## 2023-12-06 ENCOUNTER — Other Ambulatory Visit: Payer: Self-pay | Admitting: Family Medicine

## 2023-12-06 DIAGNOSIS — E7800 Pure hypercholesterolemia, unspecified: Secondary | ICD-10-CM

## 2023-12-06 DIAGNOSIS — E039 Hypothyroidism, unspecified: Secondary | ICD-10-CM

## 2024-02-02 ENCOUNTER — Telehealth: Admitting: Physician Assistant

## 2024-02-02 DIAGNOSIS — J208 Acute bronchitis due to other specified organisms: Secondary | ICD-10-CM | POA: Diagnosis not present

## 2024-02-02 DIAGNOSIS — B9689 Other specified bacterial agents as the cause of diseases classified elsewhere: Secondary | ICD-10-CM

## 2024-02-02 DIAGNOSIS — R062 Wheezing: Secondary | ICD-10-CM | POA: Diagnosis not present

## 2024-02-02 MED ORDER — PREDNISONE 10 MG (21) PO TBPK: ORAL_TABLET | ORAL | 0 refills | Status: AC

## 2024-02-02 MED ORDER — DOXYCYCLINE HYCLATE 100 MG PO TABS: 100.0000 mg | ORAL_TABLET | Freq: Two times a day (BID) | ORAL | 0 refills | Status: AC

## 2024-02-02 MED ORDER — BENZONATATE 100 MG PO CAPS: 100.0000 mg | ORAL_CAPSULE | Freq: Three times a day (TID) | ORAL | 0 refills | Status: AC | PRN

## 2024-02-02 NOTE — Progress Notes (Signed)
 Virtual Visit Consent   Christine [name], you are scheduled for a virtual visit with a [facility] provider today. Just as with appointments in the office, your consent must be obtained to participate. Your consent will be active for this visit and any virtual visit you may have with one of our providers in the next 365 days. If you have a MyChart account, a copy of this consent can be sent to you electronically.  As this is a virtual visit, video technology does not allow for your provider to perform a traditional examination. This may limit your provider's ability to fully assess your condition. If your provider identifies any concerns that need to be evaluated in person or the need to arrange testing (such as labs, EKG, etc.), we will make arrangements to do so. Although advances in technology are sophisticated, we cannot ensure that it will always work on either your end or our end. If the connection with a video visit is poor, the visit may have to be switched to a telephone visit. With either a video or telephone visit, we are not always able to ensure that we have a secure connection.  By engaging in this virtual visit, you consent to the provision of healthcare and authorize for your insurance to be billed (if applicable) for the services provided during this visit. Depending on your insurance coverage, you may receive a charge related to this service.  I need to obtain your verbal consent now. Are you willing to proceed with your visit today? [name] has provided verbal consent on 02/02/2024 for a virtual visit (video or telephone). Christine [name], [place]  Date: 02/02/2024 7:48 AM   Virtual Visit via Video Note   I, Christine [name], connected with  Christine [name]  ([id number], Aug 07, 1940) on 02/02/2024 at  7:45 AM EST by a video-enabled telemedicine application and verified that I am speaking with the correct person using two identifiers.  Location: Patient: Virtual Visit  Location Patient: Home Provider: Virtual Visit Location Provider: Home Office   I discussed the limitations of evaluation and management by telemedicine and the availability of in person appointments. The patient expressed understanding and agreed to proceed.    History of Present Illness: Christine [name] is a 83 y.o. who identifies as a female who was assigned female at birth, and is being seen today for some persistent and progressive URI symptoms starting around 9 days ago. Initially with chest congestion, mild cough and substantial voice hoarseness. Notes voice is slightly better. The cough and congestion have progressed since onset. Cough is now productive of thick yellow sputum and associated with wheezing and mild SOBOE. Denies recent travel or sick contact. Denies chest pain. Denies sinus pain.   OTC -- Nyquil, voice rest, hydration.  HPI: HPI  Problems:  Patient Active Problem List   Diagnosis Date Noted   Hyperpigmentation of skin of cheek 10/05/2023   Hearing loss 06/07/2023   Decreased appetite 11/26/2020   Shortness of breath 09/19/2019   Family history of dementia 06/07/2019   RLS (restless legs syndrome) 12/16/2015   Vitamin D  deficiency 10/15/2013   Hypothyroidism 02/27/2013   PAIN IN JOINT PELVIC REGION AND THIGH 11/19/2008   PARESTHESIA 04/08/2008   HEADACHE 04/08/2008   Hyperlipidemia 01/05/2006   Recurrent major depression 01/04/2006   Osteoporosis 11/23/2005   Insomnia 11/23/2005    Allergies: Allergies[1] Medications: Current Medications[2]  Observations/Objective: Patient is well-developed, well-nourished in no acute distress.  Resting comfortably  at home.  Head is  normocephalic, atraumatic.  No labored breathing.  Speech is clear and coherent with logical content.  Patient is alert and oriented at baseline.   Assessment and Plan: 1. Acute bacterial bronchitis (Primary) - doxycycline  (VIBRA -TABS) 100 MG tablet; Take 1 tablet (100 mg total) by mouth 2  (two) times daily.  Dispense: 14 tablet; Refill: 0 - benzonatate  (TESSALON ) 100 MG capsule; Take 1 capsule (100 mg total) by mouth 3 (three) times daily as needed for cough.  Dispense: 30 capsule; Refill: 0  2. Wheezing - predniSONE  (STERAPRED UNI-PAK 21 TAB) 10 MG (21) TBPK tablet; Take following package directions  Dispense: 21 tablet; Refill: 0  Rx Doxycycline .  Increase fluids.  Rest.  Saline nasal spray.  Probiotic.  Mucinex as directed.  Humidifier in bedroom. Tessalon  and prednisone  per orders.  Call or return to clinic if symptoms are not improving.   Follow Up Instructions: I discussed the assessment and treatment plan with the patient. The patient was provided an opportunity to ask questions and all were answered. The patient agreed with the plan and demonstrated an understanding of the instructions.  A copy of instructions were sent to the patient via MyChart unless otherwise noted below.   The patient was advised to call back or seek an in-person evaluation if the symptoms worsen or if the condition fails to improve as anticipated.    Christine Velma Lunger, PA-C     [1]  Allergies Allergen Reactions   Fosamax  [Alendronate ] Other (See Comments)   Paroxetine  Nausea Only and Other (See Comments)    Headache,weight loss, loss of appetite   [2]  Current Outpatient Medications:    benzonatate  (TESSALON ) 100 MG capsule, Take 1 capsule (100 mg total) by mouth 3 (three) times daily as needed for cough., Disp: 30 capsule, Rfl: 0   doxycycline  (VIBRA -TABS) 100 MG tablet, Take 1 tablet (100 mg total) by mouth 2 (two) times daily., Disp: 14 tablet, Rfl: 0   predniSONE  (STERAPRED UNI-PAK 21 TAB) 10 MG (21) TBPK tablet, Take following package directions, Disp: 21 tablet, Rfl: 0   atorvastatin  (LIPITOR) 10 MG tablet, TAKE 1 TABLET BY MOUTH DAILY, Disp: 90 tablet, Rfl: 3   cetirizine (ZYRTEC) 10 MG tablet, Take 10 mg by mouth daily., Disp: , Rfl:    Cholecalciferol  (VITAMIN D3) 50 MCG (2000  UT) capsule, Take 2,000 Units by mouth daily., Disp: , Rfl:    cyproheptadine  (PERIACTIN ) 4 MG tablet, TAKE 1 TABLET BY MOUTH THREE TIMES A DAY AS NEEDED, Disp: 90 tablet, Rfl: 1   Fluocin-Hydroquinone-Tretinoin (TRI-LUMA ) 0.01-4-0.05 % CREA, Apply 1 Application topically at bedtime., Disp: 30 g, Rfl: 5   levothyroxine  (SYNTHROID ) 50 MCG tablet, TAKE 1 TABLET BY MOUTH DAILY  BEFORE BREAKFAST, Disp: 90 tablet, Rfl: 0   Multiple Vitamins-Minerals (ONE-A-DAY WOMENS 50+ ADVANTAGE PO), Take 1 tablet by mouth daily., Disp: , Rfl:    rOPINIRole  (REQUIP ) 0.25 MG tablet, Take 1-2 tablets (0.25-0.5 mg total) by mouth at bedtime., Disp: 90 tablet, Rfl: 1   sertraline  (ZOLOFT ) 25 MG tablet, Take 1 tablet (25 mg total) by mouth daily., Disp: 90 tablet, Rfl: 1   vitamin B-12 (CYANOCOBALAMIN) 1000 MCG tablet, Take 1,000 mcg by mouth daily., Disp: , Rfl:    zolpidem  (AMBIEN ) 5 MG tablet, TAKE 0.5 TO 1 TABLET BY MOUTH DAILY AT BEDTIME AS NEEDED, Disp: 90 tablet, Rfl: 1

## 2024-02-02 NOTE — Patient Instructions (Signed)
 Christine [name], thank you for joining Christine Velma Lunger, PA-C for today's virtual visit.  While this provider is not your primary care provider (PCP), if your PCP is located in our provider database this encounter information will be shared with them immediately following your visit.   A [facility] MyChart account gives you access to today's visit and all your visits, tests, and labs performed at [facility]  click here if you don't have a [facility] MyChart account or go to [email or web address]  Consent: (Patient) Christine [name] provided verbal consent for this virtual visit at the beginning of the encounter.  Current Medications:  Current Outpatient Medications:    atorvastatin  (LIPITOR) 10 MG tablet, TAKE 1 TABLET BY MOUTH DAILY, Disp: 90 tablet, Rfl: 3   cetirizine (ZYRTEC) 10 MG tablet, Take 10 mg by mouth daily., Disp: , Rfl:    Cholecalciferol  (VITAMIN D3) 50 MCG (2000 UT) capsule, Take 2,000 Units by mouth daily., Disp: , Rfl:    cyproheptadine  (PERIACTIN ) 4 MG tablet, TAKE 1 TABLET BY MOUTH THREE TIMES A DAY AS NEEDED, Disp: 90 tablet, Rfl: 1   Fluocin-Hydroquinone-Tretinoin (TRI-LUMA ) 0.01-4-0.05 % CREA, Apply 1 Application topically at bedtime., Disp: 30 g, Rfl: 5   levothyroxine  (SYNTHROID ) 50 MCG tablet, TAKE 1 TABLET BY MOUTH DAILY  BEFORE BREAKFAST, Disp: 90 tablet, Rfl: 0   Multiple Vitamins-Minerals (ONE-A-DAY WOMENS 50+ ADVANTAGE PO), Take 1 tablet by mouth daily., Disp: , Rfl:    rOPINIRole  (REQUIP ) 0.25 MG tablet, Take 1-2 tablets (0.25-0.5 mg total) by mouth at bedtime., Disp: 90 tablet, Rfl: 1   sertraline  (ZOLOFT ) 25 MG tablet, Take 1 tablet (25 mg total) by mouth daily., Disp: 90 tablet, Rfl: 1   vitamin B-12 (CYANOCOBALAMIN) 1000 MCG tablet, Take 1,000 mcg by mouth daily., Disp: , Rfl:    zolpidem  (AMBIEN ) 5 MG tablet, TAKE 0.5 TO 1 TABLET BY MOUTH DAILY AT BEDTIME AS NEEDED, Disp: 90 tablet, Rfl: 1   Medications ordered in this encounter:  No  orders of the defined types were placed in this encounter.    *If you need refills on other medications prior to your next appointment, please contact your pharmacy*  Follow-Up: Call back or seek an in-person evaluation if the symptoms worsen or if the condition fails to improve as anticipated.  [facility] [phone]  Other Instructions Take antibiotic (Doxycycline ) as directed.  Increase fluids.  Get plenty of rest. Use Mucinex for congestion. Use the Tessalon  and Prednisone  pack as directed. Take a daily probiotic (I recommend Align or Culturelle, but even Activia Yogurt may be beneficial).  A humidifier placed in the bedroom may offer some relief for a dry, scratchy throat of nasal irritation.  Read information below on acute bronchitis. If you note any non-resolving, new, or worsening symptoms despite treatment, please seek an in-person evaluation ASAP.   Acute Bronchitis Bronchitis is when the airways that extend from the windpipe into the lungs get red, puffy, and painful (inflamed). Bronchitis often causes thick spit (mucus) to develop. This leads to a cough. A cough is the most common symptom of bronchitis. In acute bronchitis, the condition usually begins suddenly and goes away over time (usually in 2 weeks). Smoking, allergies, and asthma can make bronchitis worse. Repeated episodes of bronchitis may cause more lung problems.  HOME CARE Rest. Drink enough fluids to keep your pee (urine) clear or pale yellow (unless you need to limit fluids as told by your doctor). Only take over-the-counter or prescription medicines  as told by your doctor. Avoid smoking and secondhand smoke. These can make bronchitis worse. If you are a smoker, think about using nicotine gum or skin patches. Quitting smoking will help your lungs heal faster. Reduce the chance of getting bronchitis again by: Washing your hands often. Avoiding people with cold symptoms. Trying not to touch your  hands to your mouth, nose, or eyes. Follow up with your doctor as told.  GET HELP IF: Your symptoms do not improve after 1 week of treatment. Symptoms include: Cough. Fever. Coughing up thick spit. Body aches. Chest congestion. Chills. Shortness of breath. Sore throat.  GET HELP RIGHT AWAY IF:  You have an increased fever. You have chills. You have severe shortness of breath. You have bloody thick spit (sputum). You throw up (vomit) often. You lose too much body fluid (dehydration). You have a severe headache. You faint.  MAKE SURE YOU:  Understand these instructions. Will watch your condition. Will get help right away if you are not doing well or get worse. Document Released: 07/21/2007 Document Revised: 10/04/2012 Document Reviewed: 07/25/2012 [facility] Patient Information 2015 [place], [place]. This information is not intended to replace advice given to you by your health care provider. Make sure you discuss any questions you have with your health care provider.    If you have been instructed to have an in-person evaluation today at a local Urgent Care facility, please use the link below. It will take you to a list of all of our available [facility], including address, phone number and hours of operation. Please do not delay care.  [facility]  If you or a family member do not have a primary care provider, use the link below to schedule a visit and establish care. When you choose a [facility] primary care physician or advanced practice provider, you gain a long-term partner in health. Find a Primary Care Provider  Learn more about [facility]'s in-office and virtual care options: [facility] - Get Care Now

## 2024-03-06 ENCOUNTER — Other Ambulatory Visit: Payer: Self-pay | Admitting: Family Medicine

## 2024-03-06 DIAGNOSIS — E039 Hypothyroidism, unspecified: Secondary | ICD-10-CM

## 2024-04-03 ENCOUNTER — Ambulatory Visit: Admitting: Family Medicine
